# Patient Record
Sex: Male | Born: 1935 | Race: White | Hispanic: No | Marital: Married | Smoking: Never smoker
Health system: Southern US, Community
[De-identification: ages and names within clinical notes are randomized; demographics above are authoritative.]

## PROBLEM LIST (undated history)

## (undated) DIAGNOSIS — M48062 Spinal stenosis, lumbar region with neurogenic claudication: Secondary | ICD-10-CM

## (undated) DIAGNOSIS — E7800 Pure hypercholesterolemia, unspecified: Secondary | ICD-10-CM

## (undated) DIAGNOSIS — I493 Ventricular premature depolarization: Secondary | ICD-10-CM

## (undated) DIAGNOSIS — I10 Essential (primary) hypertension: Secondary | ICD-10-CM

## (undated) DIAGNOSIS — H3530 Unspecified macular degeneration: Secondary | ICD-10-CM

## (undated) DIAGNOSIS — Z973 Presence of spectacles and contact lenses: Secondary | ICD-10-CM

## (undated) DIAGNOSIS — G4700 Insomnia, unspecified: Secondary | ICD-10-CM

## (undated) DIAGNOSIS — Z87442 Personal history of urinary calculi: Secondary | ICD-10-CM

## (undated) DIAGNOSIS — Z8719 Personal history of other diseases of the digestive system: Secondary | ICD-10-CM

## (undated) DIAGNOSIS — H9190 Unspecified hearing loss, unspecified ear: Secondary | ICD-10-CM

## (undated) HISTORY — PX: BILATERAL CARPAL TUNNEL RELEASE: SHX6508

## (undated) HISTORY — PX: OTHER SURGICAL HISTORY: SHX169

## (undated) HISTORY — PX: HERNIA REPAIR: SHX51

## (undated) HISTORY — PX: TARSAL TUNNEL RELEASE: SUR1099

---

## 2008-04-24 ENCOUNTER — Encounter: Admission: RE | Admit: 2008-04-24 | Discharge: 2008-04-24 | Payer: Self-pay | Admitting: General Surgery

## 2008-04-29 ENCOUNTER — Ambulatory Visit (HOSPITAL_BASED_OUTPATIENT_CLINIC_OR_DEPARTMENT_OTHER): Admission: RE | Admit: 2008-04-29 | Discharge: 2008-04-29 | Payer: Self-pay | Admitting: General Surgery

## 2009-07-12 ENCOUNTER — Ambulatory Visit: Payer: Self-pay | Admitting: Family Medicine

## 2009-07-12 DIAGNOSIS — E785 Hyperlipidemia, unspecified: Secondary | ICD-10-CM | POA: Insufficient documentation

## 2009-07-12 DIAGNOSIS — B0089 Other herpesviral infection: Secondary | ICD-10-CM | POA: Insufficient documentation

## 2010-03-09 NOTE — Assessment & Plan Note (Signed)
Summary: Bunps-B hands/Knuckles x 1 dy rm 2   Vital Signs:  Patient Profile:   77 Years Old Male CC:      Bumps - B hands/knuckles x 1 dy Height:     71 inches Weight:      183 pounds O2 Sat:      100 % O2 treatment:    Room Air Temp:     97.1 degrees F oral Pulse rate:   93 / minute Pulse rhythm:   regular Resp:     16 per minute BP sitting:   146 / 81  (right arm) Cuff size:   regular  Vitals Entered By: Areta Haber CMA (July 12, 2009 3:12 PM)                  Current Allergies: No known allergies History of Present Illness Chief Complaint: Bumps - B hands/knuckles x 1 dy History of Present Illness: Subjective:  Patient complains of developing bumps on the joints of both hands yesterday, and more are appearing.  They are slightly tender and some appear to contain a small amount of clear fluid.  There lesions are not present elsewhere (none on feet).  No mouth lesions.  He feels well otherwise.  No fevers, chills, and sweats.  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  Current Problems: HERPETIC WHITLOW (ICD-054.6) HYPERLIPIDEMIA (ICD-272.4)   REVIEW OF SYSTEMS Constitutional Symptoms      Denies fever, chills, night sweats, weight loss, weight gain, and fatigue.  Eyes       Denies change in vision, eye pain, eye discharge, glasses, contact lenses, and eye surgery. Ear/Nose/Throat/Mouth       Denies hearing loss/aids, change in hearing, ear pain, ear discharge, dizziness, frequent runny nose, frequent nose bleeds, sinus problems, sore throat, hoarseness, and tooth pain or bleeding.  Respiratory       Denies dry cough, productive cough, wheezing, shortness of breath, asthma, bronchitis, and emphysema/COPD.  Cardiovascular       Denies murmurs, chest pain, and tires easily with exhertion.    Gastrointestinal       Denies stomach pain, nausea/vomiting, diarrhea, constipation, blood in bowel  movements, and indigestion. Genitourniary       Denies painful urination, kidney stones, and loss of urinary control. Neurological       Denies paralysis, seizures, and fainting/blackouts. Musculoskeletal       Denies muscle pain, joint pain, joint stiffness, decreased range of motion, redness, swelling, muscle weakness, and gout.  Skin       Denies bruising, unusual mles/lumps or sores, and hair/skin or nail changes.      Comments: bumps - B hands/knuckles x 1 dy Psych       Denies mood changes, temper/anger issues, anxiety/stress, speech problems, depression, and sleep problems. Other Comments: Pt states he is taking Vit B and he did wash his hands in the [facility]. Pt states he has not changes, detergents, soaps, colognes or anything. pt states that he has not been doing any gardening. Pt states he has been stress over stock market.   Past History:  Past Medical History: Hyperlipidemia Peripheral neuropathy HSV 1 Prostate PVP  Past Surgical History: Carpal tunnel release Inguinal herniorrhaphy Prostate surgery  Social History: Married Never Smoked Alcohol use-no Drug use-no Regular exercise-yes Smoking Status:  never Drug Use:  no Does Patient Exercise:  yes   Objective:  no  acute distress  Eyes:  Pupils are equal, round, and reactive to light and accomdation.  Extraocular movement is intact.  Conjunctivae are not inflamed.  Mouth:  No lesions Neck:  Supple.  No adenopathy is present.  No thyromegaly is present  Lungs:  Clear to auscultation.  Breath sounds are equal.  Heart:  Regular rate and rhythm without murmurs, rubs, or gallops.  Skin:  PIP and DIP joints of hands have single nodular slightly erythematous lesions 4mm to 7mm dia, some of which appear to have a central small vesicle.  No tenderness to palpation.  Fingers have full range of motion  Assessment New Problems: HERPETIC WHITLOW (ICD-054.6) HYPERLIPIDEMIA (ICD-272.4)  SUSPECT HERPETIC  WHITLOW  Plan New Medications/Changes: VALTREX 1 GM TABS (VALACYCLOVIR HCL) One by mouth q12hr  #14 x 0, 07/12/2009, Donna Christen MD  New Orders: New Patient Level III [phone] Planning Comments:   Begin Valtrex. Follow-up with dermatologist if not resolved one week.  Return for worsening symptoms   The patient and/or caregiver has been counseled thoroughly with regard to medications prescribed including dosage, schedule, interactions, rationale for use, and possible side effects and they verbalize understanding.  Diagnoses and expected course of recovery discussed and will return if not improved as expected or if the condition worsens. Patient and/or caregiver verbalized understanding.  Prescriptions: VALTREX 1 GM TABS (VALACYCLOVIR HCL) One by mouth q12hr  #14 x 0   Entered and Authorized by:   Donna Christen MD   Signed by:   Donna Christen MD on 07/12/2009   Method used:   Print then Give to Patient   RxID:   [id number]   Orders Added: 1)  New Patient Level III [95621]

## 2010-04-06 IMAGING — CR DG CHEST 2V
2 series · 2 of 2 positions shown · non-contrast
Comparison: None.

CLINICAL DATA: Preoperative evaluation for left inguinal hernia
repair.

CHEST - 2 VIEW

[view not recorded (1 of 2)]
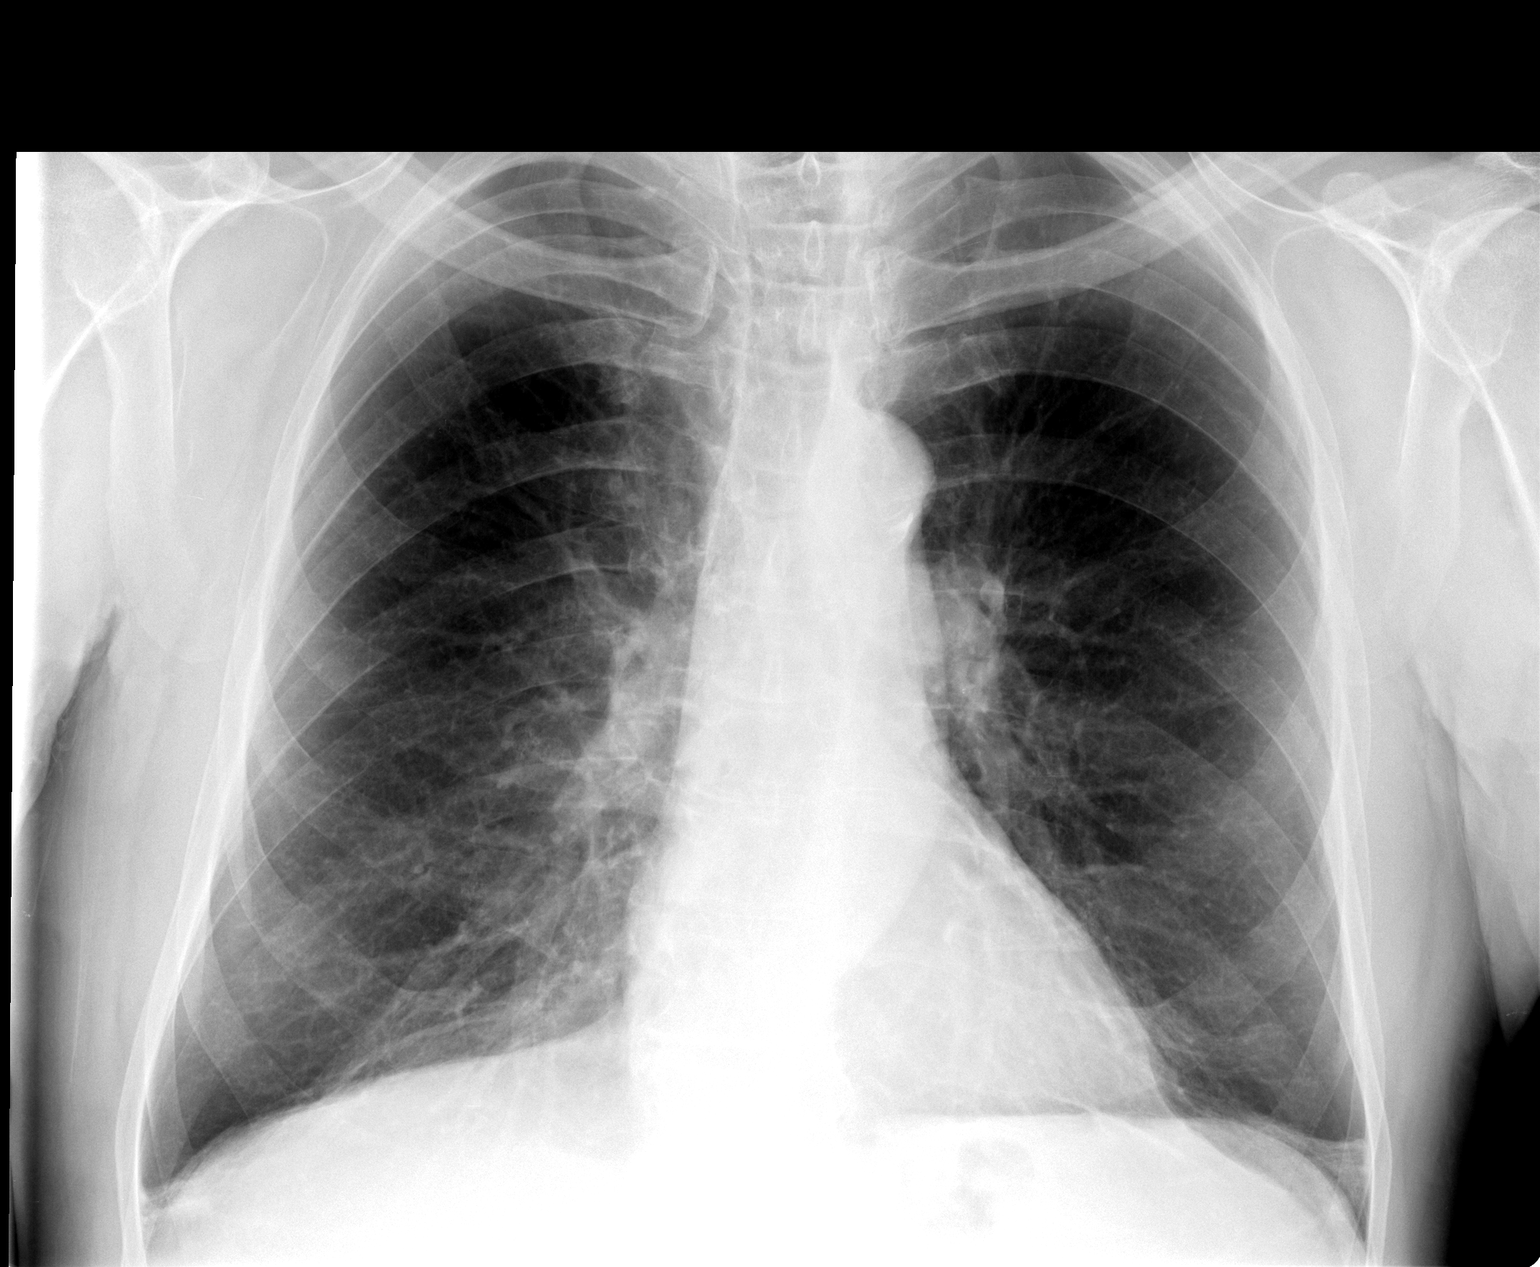

[view not recorded (2 of 2)]
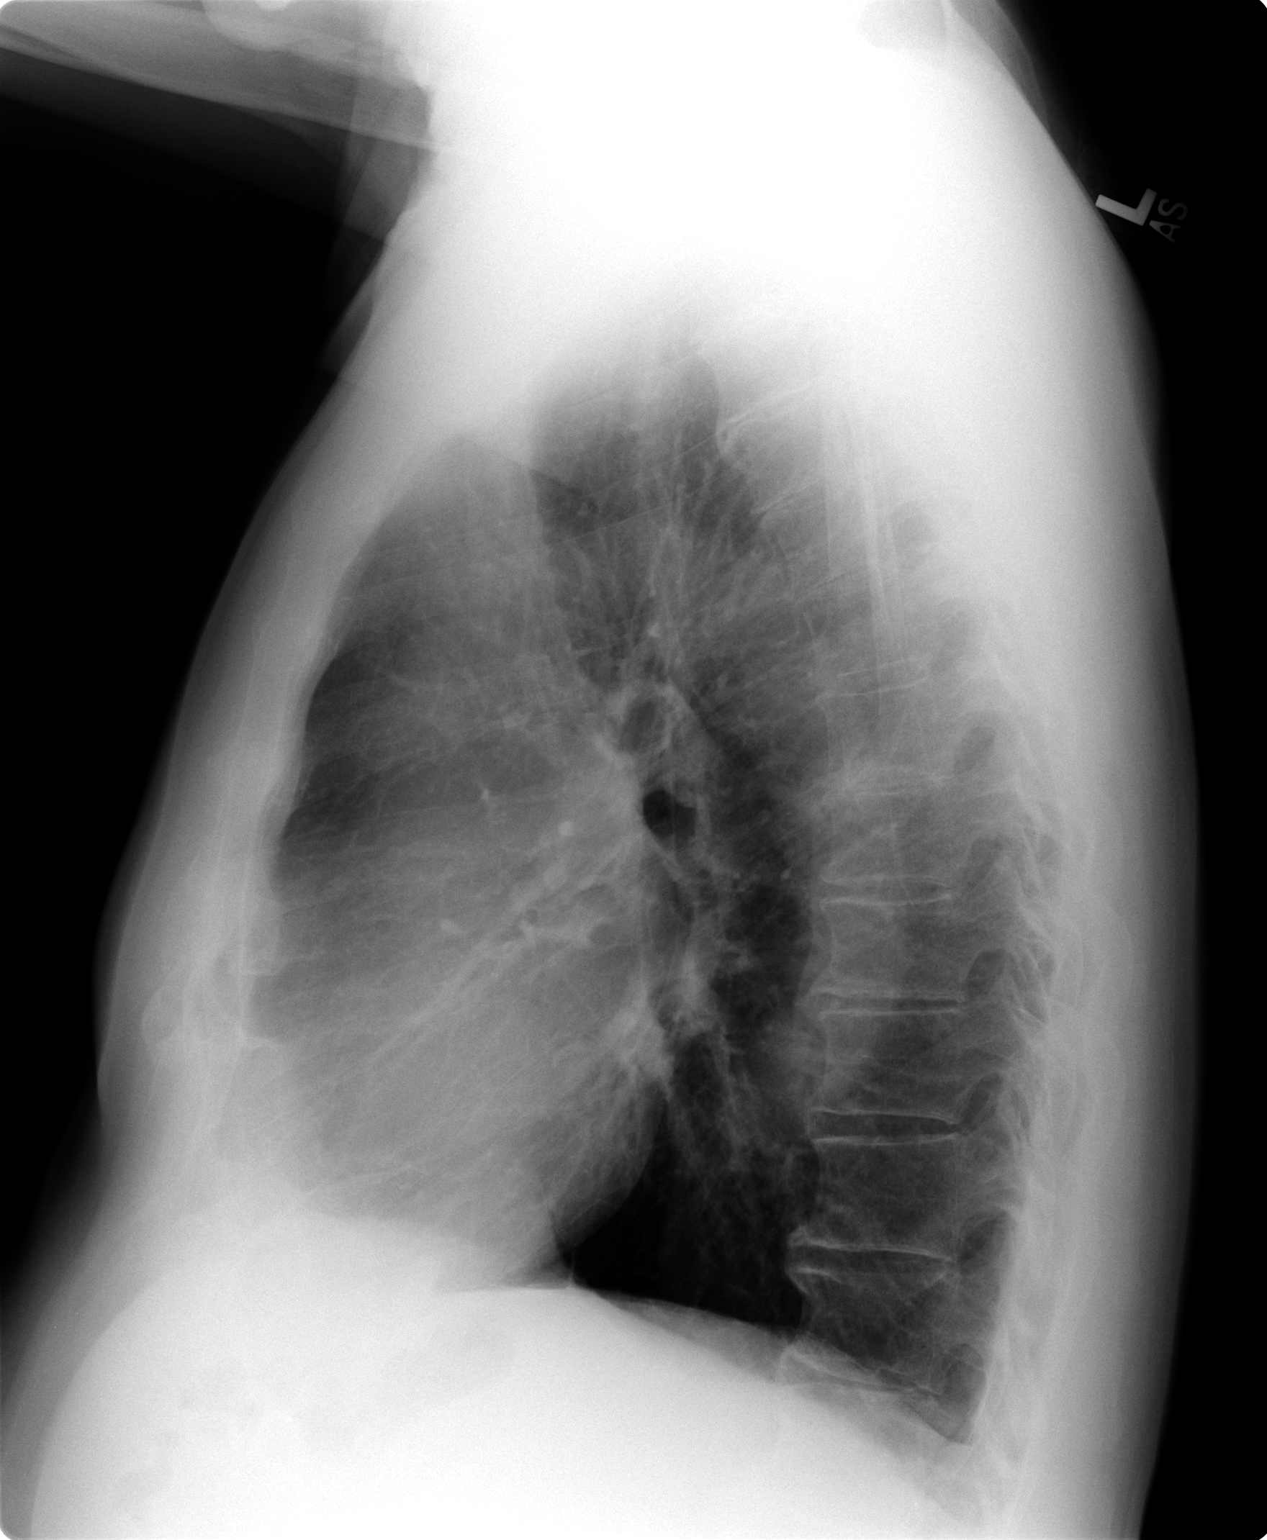

[2 of 2 positions shown; findings below may reference images not displayed]

FINDINGS: Lungs are hyperexpanded with evidence of emphysema.
There is no focal consolidation, edema, or pleural effusion. The
cardiopericardial silhouette is within normal limits for size.
Imaged bony structures of the thorax are intact.
IMPRESSION: Emphysema without acute cardiopulmonary process.

## 2010-05-20 LAB — CBC
Hemoglobin: 14.6 g/dL (ref 13.0–17.0)
MCV: 103.4 fL — ABNORMAL HIGH (ref 78.0–100.0)
RBC: 4.02 MIL/uL — ABNORMAL LOW (ref 4.22–5.81)

## 2010-05-20 LAB — BASIC METABOLIC PANEL
BUN: 12 mg/dL (ref 6–23)
Calcium: 9.1 mg/dL (ref 8.4–10.5)
GFR calc non Af Amer: >60 mL/min (ref >60)
Glucose, Bld: 76 mg/dL (ref 70–99)

## 2010-05-20 LAB — DIFFERENTIAL
Eosinophils Absolute: 0.2 10*3/uL (ref 0.0–0.7)
Lymphs Abs: 2 10*3/uL (ref 0.7–4.0)
Monocytes Absolute: 0.6 10*3/uL (ref 0.1–1.0)
Monocytes Relative: 10 % (ref 3–12)
Neutrophils Relative %: 51 % (ref 43–77)

## 2010-06-22 NOTE — Op Note (Signed)
Frederick Kirk, COLLARD NO.:  [id number]   MEDICAL RECORD NO.:  [id number]          PATIENT TYPE:  AMB   LOCATION:  DSC                          FACILITY:  [facility]   PHYSICIAN:  Juanetta Gosling, MDDATE OF BIRTH:  06/28/1931   DATE OF PROCEDURE:  04/29/2008  DATE OF DISCHARGE:                               OPERATIVE REPORT   PREOPERATIVE DIAGNOSIS:  Left inguinal hernia.   POSTOPERATIVE DIAGNOSIS:  Direct left inguinal hernia.   PROCEDURE:  Left inguinal repair with large Prolene hernia system mesh.   SURGEON:  Juanetta Gosling, MD   ASSISTANT:  None.   ANESTHESIA:  General.   SUPERVISING ANESTHESIOLOGIST:  Germaine Pomfret, MD   SPECIMENS:  None.   DRAINS:  None.   ESTIMATED BLOOD LOSS:  Minimal.   COMPLICATIONS:  None.   DISPOSITION:  To recovery room in stable condition.   INDICATIONS:  Mr. Brandow is a 77 year old male with a 1-year history of  bilateral inguinal hernia pain who about a month ago noticed a bulge on  his left side.  This was associated with some heavy activities.  On his  examination, he had a reducible, nontender left inguinal hernia and no  right inguinal hernia present.  He and I discussed open left inguinal  hernia repair with mesh.  He also has history of BPH.  We discussed the  possible need for a catheter postoperatively as well, but he seems to be  doing pretty well in terms of that right now.   PROCEDURE:  After informed consent was obtained, the patient was taken  to the operating room.  He was administered 1 g of intravenous  cefazolin.  Sequential compression devices were placed on his lower  extremities prior to induction.  He underwent general anesthesia with an  LMA.  His left groin was then prepped and draped in a standard sterile  surgical fashion.  He was prepped with ChloraPrep and an appropriate  time was passed before we began his procedure.  Surgical time-out was  performed.   A 4-cm left groin  incision was then made.  Dissection was carried out  down to the level of external abdominal oblique.  This was entered  sharply.  This was then extended through the level of the external ring.  The spermatic cord was then encircled with a Penrose drain.  The vas  deferens and surrounding cord structures were preserved throughout the  operation.  The ilioinguinal nerve was also identified and preserved  through the operation.  He had no indirect inguinal hernia identified.  He did have a small cord lipoma that was excised.  He had a large direct  defect.  This was entered with electrocautery and reduced in its  entirety.  Following this, a Ray-Tec sponge was then used to create the  preperitoneal space.  I then placed a large Prolene hernia system mesh  into this space.  The bottom portion of the bilayer was then laid flat.  I then closed his floor overlying this with a 2-0 Vicryl.  The internal  oblique was closed to  the shelving edge.  Following this, I then laid  the top portion of the bilayer flat.  This was in good position.  A T-  cut was made, wrapped around the spermatic cord.  The mesh was then  sutured into position twice at the pubic tubercle, twice medial to the T-  cut to the inguinal ligament, superiorly to the internal oblique.  The T-  cut was then tacked together with 2-0 Prolene as well and sutured to the  inguinal ligament in 2 positions.  The lateral portion was laid under  the external abdominal oblique.  The mesh was in good position.  Upon  completion, hemostasis was observed.  The external abdominal oblique was  closed with a 2-0 Vicryl.  Scarpa's fascia was closed with a 3-0 Vicryl  and the skin was closed with 4-0 Monocryl in a subcuticular fashion.  Dermabond was then placed over the wound.  A 10 mL of 0.25% Marcaine  were then infiltrated into the wound as well as performing an  ilioinguinal nerve block.  His left testicle was at the scrotum at the  completion  of the operation.  He tolerated this well, was extubated in  the operating room, and transferred to recovery room in stable  condition.      Juanetta Gosling, MD  Electronically Signed     MCW/MEDQ  D:  04/29/2008  T:  04/30/2008  Job:  [id number]   cc:   Janene Madeira, MD

## 2016-03-08 ENCOUNTER — Other Ambulatory Visit: Payer: Self-pay | Admitting: Neurosurgery

## 2016-03-09 ENCOUNTER — Encounter (HOSPITAL_COMMUNITY): Payer: Self-pay | Admitting: *Deleted

## 2016-03-09 NOTE — Progress Notes (Signed)
Pt denies SOB and chest pain but is under the care of Dr. Judithe ModestFolk, Cardiology. Pt denies having an echo and cardiac cath but stated that a stress test was performed " about 4 years ago" with Dr. Judithe ModestFolk; records requested. Pt denies having an EKG and chest x ray within the last year. Pt made aware to stop taking Aspirin, vitamins, fish oil, Melatonin and herbal medications. Do not take any NSAIDs ie: Ibuprofen, Advil, Naproxen, BC and Goody Powder or any medication containing Aspirin. Pt verbalized understanding of all pre-op instructions.

## 2016-03-10 ENCOUNTER — Encounter (HOSPITAL_COMMUNITY)
Admission: AD | Disposition: A | Discharge status: Home / Self Care | Payer: Self-pay | Source: Ambulatory Visit | Attending: Neurosurgery

## 2016-03-10 ENCOUNTER — Ambulatory Visit (HOSPITAL_COMMUNITY): Payer: Medicare Other | Admitting: Certified Registered Nurse Anesthetist

## 2016-03-10 ENCOUNTER — Encounter (HOSPITAL_COMMUNITY): Payer: Self-pay | Admitting: *Deleted

## 2016-03-10 ENCOUNTER — Inpatient Hospital Stay (HOSPITAL_COMMUNITY)
Admission: AD | Admit: 2016-03-10 | Discharge: 2016-03-11 | DRG: 516 | Disposition: A | Discharge status: Home / Self Care | Payer: Medicare Other | Source: Ambulatory Visit | Attending: Neurosurgery | Admitting: Neurosurgery

## 2016-03-10 ENCOUNTER — Ambulatory Visit (HOSPITAL_COMMUNITY): Payer: Medicare Other

## 2016-03-10 DIAGNOSIS — M4807 Spinal stenosis, lumbosacral region: Secondary | ICD-10-CM | POA: Diagnosis present

## 2016-03-10 DIAGNOSIS — Z79899 Other long term (current) drug therapy: Secondary | ICD-10-CM

## 2016-03-10 DIAGNOSIS — M5416 Radiculopathy, lumbar region: Secondary | ICD-10-CM | POA: Diagnosis present

## 2016-03-10 DIAGNOSIS — H9190 Unspecified hearing loss, unspecified ear: Secondary | ICD-10-CM | POA: Diagnosis present

## 2016-03-10 DIAGNOSIS — Z419 Encounter for procedure for purposes other than remedying health state, unspecified: Secondary | ICD-10-CM

## 2016-03-10 DIAGNOSIS — E7800 Pure hypercholesterolemia, unspecified: Secondary | ICD-10-CM | POA: Diagnosis present

## 2016-03-10 DIAGNOSIS — Z87442 Personal history of urinary calculi: Secondary | ICD-10-CM

## 2016-03-10 DIAGNOSIS — H3530 Unspecified macular degeneration: Secondary | ICD-10-CM | POA: Diagnosis present

## 2016-03-10 DIAGNOSIS — X58XXXA Exposure to other specified factors, initial encounter: Secondary | ICD-10-CM | POA: Diagnosis present

## 2016-03-10 DIAGNOSIS — M545 Low back pain: Secondary | ICD-10-CM | POA: Diagnosis present

## 2016-03-10 DIAGNOSIS — Z974 Presence of external hearing-aid: Secondary | ICD-10-CM | POA: Diagnosis not present

## 2016-03-10 DIAGNOSIS — M48062 Spinal stenosis, lumbar region with neurogenic claudication: Secondary | ICD-10-CM | POA: Diagnosis present

## 2016-03-10 DIAGNOSIS — I10 Essential (primary) hypertension: Secondary | ICD-10-CM | POA: Diagnosis present

## 2016-03-10 DIAGNOSIS — M4856XA Collapsed vertebra, not elsewhere classified, lumbar region, initial encounter for fracture: Secondary | ICD-10-CM | POA: Diagnosis present

## 2016-03-10 HISTORY — DX: Pure hypercholesterolemia, unspecified: E78.00

## 2016-03-10 HISTORY — DX: Essential (primary) hypertension: I10

## 2016-03-10 HISTORY — DX: Presence of spectacles and contact lenses: Z97.3

## 2016-03-10 HISTORY — PX: LUMBAR LAMINECTOMY/DECOMPRESSION MICRODISCECTOMY: SHX5026

## 2016-03-10 HISTORY — DX: Personal history of urinary calculi: Z87.442

## 2016-03-10 HISTORY — DX: Spinal stenosis, lumbar region with neurogenic claudication: M48.062

## 2016-03-10 HISTORY — DX: Insomnia, unspecified: G47.00

## 2016-03-10 HISTORY — DX: Ventricular premature depolarization: I49.3

## 2016-03-10 HISTORY — DX: Personal history of other diseases of the digestive system: Z87.19

## 2016-03-10 HISTORY — DX: Unspecified macular degeneration: H35.30

## 2016-03-10 HISTORY — DX: Unspecified hearing loss, unspecified ear: H91.90

## 2016-03-10 LAB — CBC
HEMATOCRIT: 41 % (ref 39.0–52.0)
Hemoglobin: 13.9 g/dL (ref 13.0–17.0)
MCH: 34.2 pg — ABNORMAL HIGH (ref 26.0–34.0)
MCHC: 33.9 g/dL (ref 30.0–36.0)
MCV: 101 fL — AB (ref 78.0–100.0)
PLATELETS: 150 10*3/uL (ref 150–400)
RBC: 4.06 MIL/uL — ABNORMAL LOW (ref 4.22–5.81)
RDW: 13.2 % (ref 11.5–15.5)
WBC: 6.5 10*3/uL (ref 4.0–10.5)

## 2016-03-10 LAB — BASIC METABOLIC PANEL
ANION GAP: 14 (ref 5–15)
BUN: 12 mg/dL (ref 6–20)
CALCIUM: 9.3 mg/dL (ref 8.9–10.3)
CO2: 21 mmol/L — AB (ref 22–32)
Chloride: 105 mmol/L (ref 101–111)
Creatinine, Ser: 1.07 mg/dL (ref 0.61–1.24)
GFR calc Af Amer: >60 mL/min (ref >60)
Glucose, Bld: 102 mg/dL — ABNORMAL HIGH (ref 65–99)
Potassium: 3.6 mmol/L (ref 3.5–5.1)
Sodium: 140 mmol/L (ref 135–145)

## 2016-03-10 LAB — SURGICAL PCR SCREEN
MRSA, PCR: NEGATIVE
Staphylococcus aureus: NEGATIVE

## 2016-03-10 SURGERY — LUMBAR LAMINECTOMY/DECOMPRESSION MICRODISCECTOMY 2 LEVELS
Anesthesia: General

## 2016-03-10 MED ORDER — EPHEDRINE SULFATE-NACL 50-0.9 MG/10ML-% IV SOSY
PREFILLED_SYRINGE | INTRAVENOUS | Status: DC | PRN
  Administered 2016-03-10 (×4): 10 mg via INTRAVENOUS

## 2016-03-10 MED ORDER — LIDOCAINE 2% (20 MG/ML) 5 ML SYRINGE
INTRAMUSCULAR | Status: AC
  Filled 2016-03-10: qty 5

## 2016-03-10 MED ORDER — BUPIVACAINE LIPOSOME 1.3 % IJ SUSP
INTRAMUSCULAR | Status: DC | PRN
Start: 2016-03-10 — End: 2016-03-10
  Administered 2016-03-10: 20 mL

## 2016-03-10 MED ORDER — HYDROCODONE-ACETAMINOPHEN 5-325 MG PO TABS
1.0000 | ORAL_TABLET | ORAL | Status: DC | PRN
  Administered 2016-03-10: 1 via ORAL
  Filled 2016-03-10: qty 1

## 2016-03-10 MED ORDER — FENTANYL CITRATE (PF) 100 MCG/2ML IJ SOLN
INTRAMUSCULAR | Status: AC
  Filled 2016-03-10: qty 2

## 2016-03-10 MED ORDER — THROMBIN 20000 UNITS EX SOLR
CUTANEOUS | Status: DC | PRN
  Administered 2016-03-10: 11:00:00 via TOPICAL

## 2016-03-10 MED ORDER — METOPROLOL SUCCINATE ER 25 MG PO TB24
37.5000 mg | ORAL_TABLET | Freq: Every day | ORAL | Status: DC
  Administered 2016-03-11: 10:00:00 37.5 mg via ORAL
  Filled 2016-03-10 (×2): qty 1

## 2016-03-10 MED ORDER — ROCURONIUM BROMIDE 100 MG/10ML IV SOLN
INTRAVENOUS | Status: DC | PRN
Start: 2016-03-10 — End: 2016-03-10
  Administered 2016-03-10: 50 mg via INTRAVENOUS

## 2016-03-10 MED ORDER — ROCURONIUM BROMIDE 50 MG/5ML IV SOSY
PREFILLED_SYRINGE | INTRAVENOUS | Status: AC
  Filled 2016-03-10: qty 5

## 2016-03-10 MED ORDER — ONDANSETRON HCL 4 MG/2ML IJ SOLN
INTRAMUSCULAR | Status: AC
  Filled 2016-03-10: qty 2

## 2016-03-10 MED ORDER — OXYCODONE HCL 5 MG PO TABS
ORAL_TABLET | ORAL | Status: AC
  Filled 2016-03-10: qty 1

## 2016-03-10 MED ORDER — MUPIROCIN 2 % EX OINT
1.0000 "application " | TOPICAL_OINTMENT | Freq: Once | CUTANEOUS | Status: AC
  Administered 2016-03-10: 1 via TOPICAL

## 2016-03-10 MED ORDER — PROPOFOL 10 MG/ML IV BOLUS
INTRAVENOUS | Status: AC
  Filled 2016-03-10: qty 20

## 2016-03-10 MED ORDER — FENTANYL CITRATE (PF) 100 MCG/2ML IJ SOLN
25.0000 ug | INTRAMUSCULAR | Status: DC | PRN
  Administered 2016-03-10: 50 ug via INTRAVENOUS

## 2016-03-10 MED ORDER — BUPIVACAINE LIPOSOME 1.3 % IJ SUSP
20.0000 mL | Freq: Once | INTRAMUSCULAR | Status: DC
  Filled 2016-03-10: qty 20

## 2016-03-10 MED ORDER — ARTIFICIAL TEARS OP OINT
TOPICAL_OINTMENT | OPHTHALMIC | Status: DC | PRN
  Administered 2016-03-10: 1 via OPHTHALMIC

## 2016-03-10 MED ORDER — ACETAMINOPHEN 325 MG PO TABS: 650.0000 mg | ORAL_TABLET | ORAL | Status: DC | PRN

## 2016-03-10 MED ORDER — CEFAZOLIN SODIUM-DEXTROSE 2-4 GM/100ML-% IV SOLN
INTRAVENOUS | Status: AC
  Filled 2016-03-10: qty 100

## 2016-03-10 MED ORDER — CYCLOBENZAPRINE HCL 10 MG PO TABS
10.0000 mg | ORAL_TABLET | Freq: Three times a day (TID) | ORAL | Status: DC | PRN
  Administered 2016-03-10: 10 mg via ORAL

## 2016-03-10 MED ORDER — PHENYLEPHRINE 40 MCG/ML (10ML) SYRINGE FOR IV PUSH (FOR BLOOD PRESSURE SUPPORT)
PREFILLED_SYRINGE | INTRAVENOUS | Status: AC
  Filled 2016-03-10: qty 10

## 2016-03-10 MED ORDER — PHENOL 1.4 % MT LIQD
1.0000 | OROMUCOSAL | Status: DC | PRN
Start: 2016-03-10 — End: 2016-03-11

## 2016-03-10 MED ORDER — BACITRACIN ZINC 500 UNIT/GM EX OINT
TOPICAL_OINTMENT | CUTANEOUS | Status: DC | PRN
  Administered 2016-03-10: 1 via TOPICAL

## 2016-03-10 MED ORDER — LACTATED RINGERS IV SOLN
INTRAVENOUS | Status: DC
  Administered 2016-03-10 (×3): via INTRAVENOUS

## 2016-03-10 MED ORDER — ACETAMINOPHEN 650 MG RE SUPP: 650.0000 mg | RECTAL | Status: DC | PRN

## 2016-03-10 MED ORDER — CALCIUM CARBONATE-VITAMIN D 500-200 MG-UNIT PO TABS: 2.0000 | ORAL_TABLET | Freq: Every day | ORAL | Status: DC

## 2016-03-10 MED ORDER — SODIUM CHLORIDE 0.9 % IR SOLN
Status: DC | PRN
  Administered 2016-03-10: 11:00:00

## 2016-03-10 MED ORDER — ONDANSETRON HCL 4 MG/2ML IJ SOLN: 4.0000 mg | INTRAMUSCULAR | Status: DC | PRN

## 2016-03-10 MED ORDER — GABAPENTIN 300 MG PO CAPS
300.0000 mg | ORAL_CAPSULE | Freq: Three times a day (TID) | ORAL | Status: DC
  Administered 2016-03-10 – 2016-03-11 (×2): 300 mg via ORAL
  Filled 2016-03-10 (×3): qty 1

## 2016-03-10 MED ORDER — SUGAMMADEX SODIUM 200 MG/2ML IV SOLN
INTRAVENOUS | Status: DC | PRN
  Administered 2016-03-10: 170 mg via INTRAVENOUS

## 2016-03-10 MED ORDER — DOCUSATE SODIUM 100 MG PO CAPS
100.0000 mg | ORAL_CAPSULE | Freq: Two times a day (BID) | ORAL | Status: DC
  Administered 2016-03-10 – 2016-03-11 (×2): 100 mg via ORAL
  Filled 2016-03-10 (×2): qty 1

## 2016-03-10 MED ORDER — SODIUM CHLORIDE 0.9% FLUSH: 3.0000 mL | INTRAVENOUS | Status: DC | PRN

## 2016-03-10 MED ORDER — MENTHOL 3 MG MT LOZG: 1.0000 | LOZENGE | OROMUCOSAL | Status: DC | PRN

## 2016-03-10 MED ORDER — OXYCODONE HCL 5 MG/5ML PO SOLN: 5.0000 mg | Freq: Once | ORAL | Status: AC | PRN

## 2016-03-10 MED ORDER — BISACODYL 10 MG RE SUPP: 10.0000 mg | Freq: Every day | RECTAL | Status: DC | PRN

## 2016-03-10 MED ORDER — ONDANSETRON HCL 4 MG/2ML IJ SOLN
INTRAMUSCULAR | Status: DC | PRN
  Administered 2016-03-10: 4 mg via INTRAVENOUS

## 2016-03-10 MED ORDER — LIDOCAINE 2% (20 MG/ML) 5 ML SYRINGE
INTRAMUSCULAR | Status: DC | PRN
  Administered 2016-03-10: 60 mg via INTRAVENOUS

## 2016-03-10 MED ORDER — BUPIVACAINE-EPINEPHRINE (PF) 0.5% -1:200000 IJ SOLN
INTRAMUSCULAR | Status: AC
  Filled 2016-03-10: qty 30

## 2016-03-10 MED ORDER — ATORVASTATIN CALCIUM 20 MG PO TABS
20.0000 mg | ORAL_TABLET | Freq: Every day | ORAL | Status: DC
  Filled 2016-03-10: qty 1

## 2016-03-10 MED ORDER — ONDANSETRON HCL 4 MG/2ML IJ SOLN: 4.0000 mg | Freq: Once | INTRAMUSCULAR | Status: DC | PRN

## 2016-03-10 MED ORDER — SUCCINYLCHOLINE CHLORIDE 200 MG/10ML IV SOSY
PREFILLED_SYRINGE | INTRAVENOUS | Status: AC
  Filled 2016-03-10: qty 10

## 2016-03-10 MED ORDER — DEXTROSE 5 % IV SOLN
INTRAVENOUS | Status: DC | PRN
  Administered 2016-03-10: 40 ug/min via INTRAVENOUS
  Administered 2016-03-10: 75 ug/min via INTRAVENOUS

## 2016-03-10 MED ORDER — CEFAZOLIN SODIUM-DEXTROSE 2-4 GM/100ML-% IV SOLN
2.0000 g | INTRAVENOUS | Status: AC
  Administered 2016-03-10: 2 g via INTRAVENOUS

## 2016-03-10 MED ORDER — EPHEDRINE 5 MG/ML INJ
INTRAVENOUS | Status: AC
  Filled 2016-03-10: qty 10

## 2016-03-10 MED ORDER — THROMBIN 20000 UNITS EX SOLR
CUTANEOUS | Status: AC
  Filled 2016-03-10: qty 20000

## 2016-03-10 MED ORDER — CYCLOBENZAPRINE HCL 10 MG PO TABS
ORAL_TABLET | ORAL | Status: AC
  Filled 2016-03-10: qty 1

## 2016-03-10 MED ORDER — SUGAMMADEX SODIUM 200 MG/2ML IV SOLN
INTRAVENOUS | Status: AC
  Filled 2016-03-10: qty 2

## 2016-03-10 MED ORDER — ARTIFICIAL TEARS OP OINT
TOPICAL_OINTMENT | OPHTHALMIC | Status: AC
  Filled 2016-03-10: qty 3.5

## 2016-03-10 MED ORDER — GELATIN ABSORBABLE MT POWD
OROMUCOSAL | Status: DC | PRN
  Administered 2016-03-10: 13:00:00 via TOPICAL

## 2016-03-10 MED ORDER — FENTANYL CITRATE (PF) 100 MCG/2ML IJ SOLN
INTRAMUSCULAR | Status: DC | PRN
  Administered 2016-03-10 (×2): 100 ug via INTRAVENOUS

## 2016-03-10 MED ORDER — MUPIROCIN 2 % EX OINT
TOPICAL_OINTMENT | CUTANEOUS | Status: AC
  Filled 2016-03-10: qty 22

## 2016-03-10 MED ORDER — CHLORHEXIDINE GLUCONATE CLOTH 2 % EX PADS: 6.0000 | MEDICATED_PAD | Freq: Once | CUTANEOUS | Status: DC

## 2016-03-10 MED ORDER — SODIUM CHLORIDE 0.9% FLUSH: 3.0000 mL | Freq: Two times a day (BID) | INTRAVENOUS | Status: DC

## 2016-03-10 MED ORDER — OXYCODONE-ACETAMINOPHEN 5-325 MG PO TABS: 1.0000 | ORAL_TABLET | ORAL | Status: DC | PRN

## 2016-03-10 MED ORDER — MORPHINE SULFATE (PF) 2 MG/ML IV SOLN: 1.0000 mg | INTRAVENOUS | Status: DC | PRN

## 2016-03-10 MED ORDER — PROPOFOL 10 MG/ML IV BOLUS
INTRAVENOUS | Status: DC | PRN
  Administered 2016-03-10: 150 mg via INTRAVENOUS

## 2016-03-10 MED ORDER — CEFAZOLIN SODIUM-DEXTROSE 2-4 GM/100ML-% IV SOLN
2.0000 g | Freq: Three times a day (TID) | INTRAVENOUS | Status: AC
  Administered 2016-03-10 – 2016-03-11 (×2): 2 g via INTRAVENOUS
  Filled 2016-03-10: qty 100

## 2016-03-10 MED ORDER — THROMBIN 5000 UNITS EX SOLR
CUTANEOUS | Status: AC
  Filled 2016-03-10: qty 5000

## 2016-03-10 MED ORDER — MORPHINE SULFATE (PF) 4 MG/ML IV SOLN: 1.0000 mg | INTRAVENOUS | Status: DC | PRN

## 2016-03-10 MED ORDER — BACITRACIN ZINC 500 UNIT/GM EX OINT
TOPICAL_OINTMENT | CUTANEOUS | Status: AC
  Filled 2016-03-10: qty 28.35

## 2016-03-10 MED ORDER — TERAZOSIN HCL 5 MG PO CAPS
10.0000 mg | ORAL_CAPSULE | Freq: Every day | ORAL | Status: DC
  Administered 2016-03-10 – 2016-03-11 (×2): 10 mg via ORAL
  Filled 2016-03-10 (×2): qty 2

## 2016-03-10 MED ORDER — OXYCODONE HCL 5 MG PO TABS
5.0000 mg | ORAL_TABLET | Freq: Once | ORAL | Status: AC | PRN
  Administered 2016-03-10: 5 mg via ORAL

## 2016-03-10 MED ORDER — BUPIVACAINE-EPINEPHRINE (PF) 0.5% -1:200000 IJ SOLN
INTRAMUSCULAR | Status: DC | PRN
  Administered 2016-03-10: 10 mL via PERINEURAL

## 2016-03-10 SURGICAL SUPPLY — 59 items
BAG DECANTER FOR FLEXI CONT (MISCELLANEOUS) ×3 IMPLANT
BENZOIN TINCTURE PRP APPL 2/3 (GAUZE/BANDAGES/DRESSINGS) ×3 IMPLANT
BLADE CLIPPER SURG (BLADE) IMPLANT
BUR MATCHSTICK NEURO 3.0 LAGG (BURR) ×3 IMPLANT
BUR PRECISION FLUTE 6.0 (BURR) IMPLANT
CANISTER SUCT 3000ML PPV (MISCELLANEOUS) ×3 IMPLANT
CARTRIDGE OIL MAESTRO DRILL (MISCELLANEOUS) ×1 IMPLANT
CLOSURE WOUND 1/2 X4 (GAUZE/BANDAGES/DRESSINGS) ×1
DIFFUSER DRILL AIR PNEUMATIC (MISCELLANEOUS) ×3 IMPLANT
DRAPE LAPAROTOMY 100X72X124 (DRAPES) ×3 IMPLANT
DRAPE MICROSCOPE LEICA (MISCELLANEOUS) ×3 IMPLANT
DRAPE POUCH INSTRU U-SHP 10X18 (DRAPES) ×3 IMPLANT
DRAPE SURG 17X23 STRL (DRAPES) ×12 IMPLANT
ELECT BLADE 4.0 EZ CLEAN MEGAD (MISCELLANEOUS) ×3
ELECT REM PT RETURN 9FT ADLT (ELECTROSURGICAL) ×3
ELECTRODE BLDE 4.0 EZ CLN MEGD (MISCELLANEOUS) ×1 IMPLANT
ELECTRODE REM PT RTRN 9FT ADLT (ELECTROSURGICAL) ×1 IMPLANT
GAUZE SPONGE 4X4 12PLY STRL (GAUZE/BANDAGES/DRESSINGS) ×3 IMPLANT
GAUZE SPONGE 4X4 16PLY XRAY LF (GAUZE/BANDAGES/DRESSINGS) IMPLANT
GLOVE BIO SURGEON STRL SZ8 (GLOVE) ×3 IMPLANT
GLOVE BIO SURGEON STRL SZ8.5 (GLOVE) ×3 IMPLANT
GLOVE BIOGEL PI IND STRL 7.0 (GLOVE) ×3 IMPLANT
GLOVE BIOGEL PI IND STRL 7.5 (GLOVE) ×1 IMPLANT
GLOVE BIOGEL PI IND STRL 8.5 (GLOVE) ×1 IMPLANT
GLOVE BIOGEL PI INDICATOR 7.0 (GLOVE) ×6
GLOVE BIOGEL PI INDICATOR 7.5 (GLOVE) ×2
GLOVE BIOGEL PI INDICATOR 8.5 (GLOVE) ×2
GLOVE ECLIPSE 8.5 STRL (GLOVE) ×6 IMPLANT
GLOVE EXAM NITRILE LRG STRL (GLOVE) ×3 IMPLANT
GLOVE EXAM NITRILE XL STR (GLOVE) ×3 IMPLANT
GLOVE EXAM NITRILE XS STR PU (GLOVE) IMPLANT
GLOVE INDICATOR 7.5 STRL GRN (GLOVE) ×3 IMPLANT
GLOVE INDICATOR 8.5 STRL (GLOVE) ×3 IMPLANT
GLOVE SURG SS PI 7.0 STRL IVOR (GLOVE) ×3 IMPLANT
GOWN STRL REUS W/ TWL LRG LVL3 (GOWN DISPOSABLE) ×2 IMPLANT
GOWN STRL REUS W/ TWL XL LVL3 (GOWN DISPOSABLE) ×1 IMPLANT
GOWN STRL REUS W/TWL 2XL LVL3 (GOWN DISPOSABLE) ×3 IMPLANT
GOWN STRL REUS W/TWL LRG LVL3 (GOWN DISPOSABLE) ×4
GOWN STRL REUS W/TWL XL LVL3 (GOWN DISPOSABLE) ×2
HEMOSTAT POWDER SURGIFOAM 1G (HEMOSTASIS) ×3 IMPLANT
KIT BASIN OR (CUSTOM PROCEDURE TRAY) ×3 IMPLANT
KIT ROOM TURNOVER OR (KITS) ×3 IMPLANT
NEEDLE HYPO 21X1.5 SAFETY (NEEDLE) ×3 IMPLANT
NEEDLE HYPO 22GX1.5 SAFETY (NEEDLE) ×3 IMPLANT
NS IRRIG 1000ML POUR BTL (IV SOLUTION) ×3 IMPLANT
OIL CARTRIDGE MAESTRO DRILL (MISCELLANEOUS) ×3
PACK LAMINECTOMY NEURO (CUSTOM PROCEDURE TRAY) ×3 IMPLANT
PAD ARMBOARD 7.5X6 YLW CONV (MISCELLANEOUS) ×9 IMPLANT
PATTIES SURGICAL .5 X1 (DISPOSABLE) IMPLANT
RUBBERBAND STERILE (MISCELLANEOUS) ×6 IMPLANT
SPONGE SURGIFOAM ABS GEL 100 (HEMOSTASIS) ×3 IMPLANT
STRIP CLOSURE SKIN 1/2X4 (GAUZE/BANDAGES/DRESSINGS) ×2 IMPLANT
SUT VIC AB 1 CT1 18XBRD ANBCTR (SUTURE) ×2 IMPLANT
SUT VIC AB 1 CT1 8-18 (SUTURE) ×4
SUT VIC AB 2-0 CP2 18 (SUTURE) ×6 IMPLANT
SYRINGE 20CC LL (MISCELLANEOUS) ×3 IMPLANT
TOWEL OR 17X24 6PK STRL BLUE (TOWEL DISPOSABLE) ×3 IMPLANT
TOWEL OR 17X26 10 PK STRL BLUE (TOWEL DISPOSABLE) ×3 IMPLANT
WATER STERILE IRR 1000ML POUR (IV SOLUTION) ×3 IMPLANT

## 2016-03-10 NOTE — Anesthesia Preprocedure Evaluation (Signed)
Anesthesia Evaluation  Patient identified by MRN, date of birth, ID band Patient awake    Reviewed: Allergy & Precautions, NPO status , Patient's Chart, lab work & pertinent test results  Airway Mallampati: II  TM Distance: >3 FB Neck ROM: Full    Dental  (+) Teeth Intact, Dental Advisory Given   Pulmonary    breath sounds clear to auscultation       Cardiovascular hypertension,  Rhythm:Regular Rate:Normal     Neuro/Psych    GI/Hepatic   Endo/Other    Renal/GU      Musculoskeletal   Abdominal   Peds  Hematology   Anesthesia Other Findings   Reproductive/Obstetrics                             Anesthesia Physical Anesthesia Plan  ASA: III  Anesthesia Plan: General   Post-op Pain Management:    Induction: Intravenous  Airway Management Planned: Oral ETT  Additional Equipment:   Intra-op Plan:   Post-operative Plan: Extubation in OR  Informed Consent: I have reviewed the patients History and Physical, chart, labs and discussed the procedure including the risks, benefits and alternatives for the proposed anesthesia with the patient or authorized representative who has indicated his/her understanding and acceptance.   Dental advisory given  Plan Discussed with: CRNA and Anesthesiologist  Anesthesia Plan Comments:         Anesthesia Quick Evaluation  

## 2016-03-10 NOTE — Transfer of Care (Signed)
Immediate Anesthesia Transfer of Care Note  Patient: Frederick Kirk  Procedure(s) Performed: Procedure(s): LAMINECTOMY, DISCECTOMY AND FORAMINOTOMY LUMBAR FOUR LUMBAR FIVE, LUMBAR FIVE SACRAL ONE (N/A)  Patient Location: PACU  Anesthesia Type:General  Level of Consciousness: sedated  Airway & Oxygen Therapy: Patient Spontanous Breathing and Patient connected to nasal cannula oxygen  Post-op Assessment: Report given to RN, Post -op Vital signs reviewed and stable and Patient moving all extremities  Post vital signs: Reviewed and stable  Last Vitals:  Vitals:   03/10/16 1430 03/10/16 1445  BP:    Pulse: (!) 59 (!) 57  Resp: 15 16  Temp:      Last Pain:  Vitals:   03/10/16 1445  TempSrc:   PainSc: 4       Patients Stated Pain Goal: 2 (03/10/16 16100808)  Complications: No apparent anesthesia complications

## 2016-03-10 NOTE — H&P (Signed)
Subjective: The patient is an 83 year old white male who has complained of back, buttock and leg pain consistent with neurogenic claudication. He has failed medical management and was worked up with a lumbar MRI. This demonstrated the patient had a mild compression fracture, herniated disc and spinal stenosis at L4-5 and L5-S1. I discussed the various treatment options with the patient including surgery. He has decided proceed with L4-5 and L5-S1 laminectomy and discectomy.  Past Medical History:  Diagnosis Date  . History of hiatal hernia   . History of kidney stones   . HOH (hard of hearing)    wears hearing aids B/L   . Hypercholesteremia   . Hypertension   . Insomnia   . Macular degeneration    early B/L  . PVC's (premature ventricular contractions)   . Spinal stenosis of lumbar region with neurogenic claudication   . Wears glasses     Past Surgical History:  Procedure Laterality Date  . BILATERAL CARPAL TUNNEL RELEASE    . HERNIA REPAIR    . laparoscopic knee surgery    . TARSAL TUNNEL RELEASE      Allergies  Allergen Reactions  . No Known Allergies     Social History  Substance Use Topics  . Smoking status: Never Smoker  . Smokeless tobacco: Never Used  . Alcohol use No    Family History  Problem Relation Age of Onset  . ALS Mother    Prior to Admission medications   Medication Sig Start Date End Date Taking? Authorizing Provider  atorvastatin (LIPITOR) 20 MG tablet Take 1 tablet by mouth every evening. 12/14/15  Yes Historical Provider, MD  Calcium Carbonate-Vitamin D (CALCIUM PLUS VITAMIN D) 300-100 MG-UNIT CAPS Take 3 tablets by mouth daily.   Yes Historical Provider, MD  gabapentin (NEURONTIN) 300 MG capsule Take 1 capsule by mouth 3 (three) times daily. 02/18/16  Yes Historical Provider, MD  Melatonin 5 MG TABS Take 5 mg by mouth at bedtime.   Yes Historical Provider, MD  metoprolol succinate (TOPROL-XL) 25 MG 24 hr tablet Take 37.5 mg by mouth daily. 01/29/16   Yes Historical Provider, MD  terazosin (HYTRIN) 10 MG capsule Take 1 capsule by mouth daily. 02/26/16  Yes Historical Provider, MD     Review of Systems  Positive ROS: As above  All other systems have been reviewed and were otherwise negative with the exception of those mentioned in the HPI and as above.  Objective: Vital signs in last 24 hours: Temp:  [97.9 F (36.6 C)] 97.9 F (36.6 C) (02/01 0824) Pulse Rate:  [67] 67 (02/01 0824) Resp:  [18] 18 (02/01 0824) BP: (175)/(66) 175/66 (02/01 0824) SpO2:  [98 %] 98 % (02/01 0824) Weight:  [77.1 kg (170 lb)] 77.1 kg (170 lb) (02/01 0815)  General Appearance: Alert Head: Normocephalic, without obvious abnormality, atraumatic Eyes: PERRL, conjunctiva/corneas clear, EOM's intact,    Ears: Normal  Throat: Normal  Neck: Supple, Back: unremarkable Lungs: Clear to auscultation bilaterally, respirations unlabored Heart: Regular rate and rhythm, no murmur, rub or gallop Abdomen: Soft, non-tender Extremities: Extremities normal, atraumatic, no cyanosis or edema Skin: unremarkable  NEUROLOGIC:   Mental status: alert and oriented,Motor Exam - grossly normal Sensory Exam - grossly normal Reflexes:  Coordination - grossly normal Gait - grossly normal Balance - grossly normal Cranial Nerves: I: smell Not tested  II: visual acuity  OS: Normal  OD: Normal   II: visual fields Full to confrontation  II: pupils Equal, round, reactive to light  III,VII: ptosis None  III,IV,VI: extraocular muscles  Full ROM  V: mastication Normal  V: facial light touch sensation  Normal  V,VII: corneal reflex  Present  VII: facial muscle function - upper  Normal  VII: facial muscle function - lower Normal  VIII: hearing Not tested  IX: soft palate elevation  Normal  IX,X: gag reflex Present  XI: trapezius strength  5/5  XI: sternocleidomastoid strength 5/5  XI: neck flexion strength  5/5  XII: tongue strength  Normal    Data Review Lab Results   Component Value Date   WBC 6.5 03/10/2016   HGB 13.9 03/10/2016   HCT 41.0 03/10/2016   MCV 101.0 (H) 03/10/2016   PLT 150 03/10/2016   Lab Results  Component Value Date   NA 140 03/10/2016   K 3.6 03/10/2016   CL 105 03/10/2016   CO2 21 (L) 03/10/2016   BUN 12 03/10/2016   CREATININE 1.07 03/10/2016   GLUCOSE 102 (H) 03/10/2016   No results found for: INR, PROTIME  Assessment/Plan: L4-5 and L5-S1 spinal stenosis, herniated disks, neurogenic claudication, lumbar radiculopathy, lumbago, compression fracture: I have discussed the situation with the patient and his wife and reviewed his imaging studies with him. We have discussed various treatment options including surgery. I have described the surgical treatment option of an L4-5 and L5-S1 laminectomy and discectomy. I have shown them surgical models. We have discussed the risks, benefits, alternatives, and likelihood of achieving her goals for surgery. I have answered all the patient's questions. He has decided to proceed with surgery.   Coden Franchi D 03/10/2016 10:52 AM

## 2016-03-10 NOTE — Anesthesia Procedure Notes (Signed)
Procedure Name: Intubation Date/Time: 03/10/2016 11:10 AM Performed by: Trixie Deis A Pre-anesthesia Checklist: Patient identified, Emergency Drugs available, Suction available and Patient being monitored Patient Re-evaluated:Patient Re-evaluated prior to inductionOxygen Delivery Method: Circle System Utilized Preoxygenation: Pre-oxygenation with 100% oxygen Intubation Type: IV induction Ventilation: Mask ventilation without difficulty Laryngoscope Size: Mac and 4 Grade View: Grade I Tube type: Oral Tube size: 8.0 mm Number of attempts: 1 Airway Equipment and Method: Stylet and Oral airway Placement Confirmation: ETT inserted through vocal cords under direct vision,  positive ETCO2 and breath sounds checked- equal and bilateral Secured at: 22 cm Tube secured with: Tape Dental Injury: Teeth and Oropharynx as per pre-operative assessment

## 2016-03-10 NOTE — Anesthesia Postprocedure Evaluation (Signed)
Anesthesia Post Note  Patient: Frederick Kirk  Procedure(s) Performed: Procedure(s) (LRB): LAMINECTOMY, DISCECTOMY AND FORAMINOTOMY LUMBAR FOUR LUMBAR FIVE, LUMBAR FIVE SACRAL ONE (N/A)  Patient location during evaluation: PACU Anesthesia Type: General Level of consciousness: awake, awake and alert and oriented Pain management: pain level controlled Vital Signs Assessment: post-procedure vital signs reviewed and stable Respiratory status: spontaneous breathing, nonlabored ventilation and respiratory function stable Cardiovascular status: blood pressure returned to baseline Anesthetic complications: no       Last Vitals:  Vitals:   03/10/16 1515 03/10/16 1554  BP:  (!) 153/86  Pulse:  (!) 58  Resp: 16 16  Temp: 36.1 C     Last Pain:  Vitals:   03/10/16 1515  TempSrc:   PainSc: 2                  Emari Hreha COKER

## 2016-03-10 NOTE — Op Note (Signed)
Brief history: The patient is an 83 year old white male who has complained of back and bilateral buttock and leg pain consistent with neurogenic claudication. He has failed medical management. He was worked up with a lumbar MRI which demonstrated spinal stenosis and a right herniated disc at L4-5 and L5-S1. I discussed the various treatment options with the patient including surgery. He has weighed the risks, benefits, and alternatives to surgery and decided proceed with a L4-5 and L5-S1 laminectomy and discectomy.  Preoperative diagnosis: L4-5 and L5-S1 spinal stenosis, lumbar radiculopathy, neurogenic claudication, lumbago, herniated disc  Postoperative diagnosis: L4-5 and L5-S1 spinal stenosis, lumbago, lumbar radiculopathy, neurogenic claudication  Procedure: L5-S1 laminectomy, bilateral L4-5 laminotomy foraminotomy using micro-dissection to decompress the bilateral L5 and S1 nerve roots  Surgeon: Dr. Delma OfficerJeff Neeko Pharo  Asst.: Barnett AbuHenry Elsner  Anesthesia: Gen. endotracheal  Estimated blood loss: 125 mL  Drains: None  Complications: None  Description of procedure: The patient was brought to the operating room by the anesthesia team. General endotracheal anesthesia was induced. The patient was turned to the prone position on the Wilson frame. The patient's lumbosacral region was then prepared with Betadine scrub and Betadine solution. Sterile drapes were applied.  I then injected the area to be incised with Marcaine with epinephrine solution. I then used a scalpel to make a linear midline incision over the L4-5 and L5-S1 intervertebral disc space. I then used electrocautery to perform a bilateral subperiosteal dissection exposing the spinous process and lamina of L4, L5 and the upper sacrum. We obtained intraoperative radiograph to confirm our location. I then inserted the [facility] for exposure.  We then brought the operative microscope into the field. Under its magnification and  illumination we completed the microdissection. I used a high-speed drill to perform bilateral laminotomies at L4-5 and L5-S1. I then used a Kerrison punches to complete the L5 laminectomy and to widen the bilateral laminotomies at L4-5 and removed the ligamentum flavum at L4-5 and L5-S1. We then used microdissection to free up the thecal sac and the L5-S1 nerve root from the epidural tissue. I then used a Kerrison punch to perform a foraminotomy at about the L5 and S1 nerve root. We then using the nerve root retractor to gently retract the right thecal sac at L4-5 and L5-S1 medially. We inspected ventrally but did not find a herniated disc much just quite a bit of fat. I then palpated along the ventral surface of the thecal sac and along exit route of the bilateral L5 and S1 nerve root and noted that the neural structures were well decompressed. This completed the decompression.  We then obtained hemostasis using bipolar electrocautery. We irrigated the wound out with bacitracin solution. We then removed the retractor. We then reapproximated the patient's thoracolumbar fascia with interrupted #1 Vicryl suture. We then reapproximated the patient's subcutaneous tissue with interrupted 2-0 Vicryl suture. We then reapproximated patient's skin with Steri-Strips and benzoin. The was then coated with bacitracin ointment. The drapes were removed. The patient was subsequently returned to the supine position where they were extubated by the anesthesia team. The patient was then transported to the postanesthesia care unit in stable condition. All sponge instrument and needle counts were reportedly correct at the end of this case.

## 2016-03-10 NOTE — Progress Notes (Signed)
Staff on 3C unable to take report -they need to call me back.

## 2016-03-11 ENCOUNTER — Encounter (HOSPITAL_COMMUNITY): Payer: Self-pay | Admitting: Neurosurgery

## 2016-03-11 LAB — BASIC METABOLIC PANEL
ANION GAP: 12 (ref 5–15)
BUN: 12 mg/dL (ref 6–20)
CALCIUM: 8.6 mg/dL — AB (ref 8.9–10.3)
CO2: 24 mmol/L (ref 22–32)
Chloride: 102 mmol/L (ref 101–111)
Creatinine, Ser: 1.25 mg/dL — ABNORMAL HIGH (ref 0.61–1.24)
GFR calc Af Amer: >60 mL/min (ref >60)
GFR calc non Af Amer: 52 mL/min — ABNORMAL LOW (ref >60)
GLUCOSE: 119 mg/dL — AB (ref 65–99)
Potassium: 3.8 mmol/L (ref 3.5–5.1)
SODIUM: 138 mmol/L (ref 135–145)

## 2016-03-11 LAB — CBC
HCT: 37.6 % — ABNORMAL LOW (ref 39.0–52.0)
Hemoglobin: 12.4 g/dL — ABNORMAL LOW (ref 13.0–17.0)
MCH: 34 pg (ref 26.0–34.0)
MCHC: 33 g/dL (ref 30.0–36.0)
MCV: 103 fL — AB (ref 78.0–100.0)
PLATELETS: 141 10*3/uL — AB (ref 150–400)
RBC: 3.65 MIL/uL — ABNORMAL LOW (ref 4.22–5.81)
RDW: 13.5 % (ref 11.5–15.5)
WBC: 7.5 10*3/uL (ref 4.0–10.5)

## 2016-03-11 MED ORDER — CYCLOBENZAPRINE HCL 5 MG PO TABS: 5.0000 mg | ORAL_TABLET | Freq: Three times a day (TID) | ORAL | Status: DC | PRN

## 2016-03-11 MED ORDER — DOCUSATE SODIUM 100 MG PO CAPS: 100.0000 mg | ORAL_CAPSULE | Freq: Two times a day (BID) | ORAL | 0 refills | Status: DC

## 2016-03-11 MED ORDER — OXYCODONE-ACETAMINOPHEN 5-325 MG PO TABS: 1.0000 | ORAL_TABLET | ORAL | 0 refills | Status: DC | PRN

## 2016-03-11 MED ORDER — CYCLOBENZAPRINE HCL 5 MG PO TABS: 5.0000 mg | ORAL_TABLET | Freq: Three times a day (TID) | ORAL | 1 refills | Status: DC | PRN

## 2016-03-11 NOTE — Evaluation (Signed)
Physical Therapy Evaluation Patient Details Name: Frederick Kirk MRN: [id number] DOB: 08-03-1931 Today's Date: 03/11/2016   History of Present Illness  Pt is an 83 y/o male who presents s/p L4-S1 laminectomy/decompression on 03/10/16.  Clinical Impression  Pt admitted with above diagnosis. Pt currently with functional limitations due to the deficits listed below (see PT Problem List). At the time of PT eval pt was able to perform transfers and ambulation with min guard to supervision for safety. Pt very detail oriented and would benefit from another PT session if still here tomorrow. Pt will benefit from skilled PT to increase their independence and safety with mobility to allow discharge to the venue listed below.       Follow Up Recommendations Outpatient PT    Equipment Recommendations  3in1 (PT)    Recommendations for Other Services       Precautions / Restrictions Precautions Precautions: Fall;Back Precaution Booklet Issued: Yes (comment) Precaution Comments: Reviewed precautions sheet in detail. Pt was cued for precautions during functional mobility.  Pt has bilateral ankle braces he wears daily.  Restrictions Weight Bearing Restrictions: No      Mobility  Bed Mobility Overal bed mobility: Needs Assistance Bed Mobility: Rolling;Sidelying to Sit Rolling: Supervision Sidelying to sit: Supervision       General bed mobility comments: Gross supervision for safety. Pt was able to transition to EOB without physical assist. VC's provided for log roll technique.   Transfers Overall transfer level: Needs assistance Equipment used: Rolling walker (2 wheeled) Transfers: Sit to/from Stand Sit to Stand: Min guard         General transfer comment: Close guard for safety. Pt was able to power-up to full standing position with VC's for hand placement on seated surface for safety. Slight unsteadiness noted however pt was able to recover without assist.    Ambulation/Gait Ambulation/Gait assistance: Min guard Ambulation Distance (Feet): 200 Feet Assistive device: Rolling walker (2 wheeled) Gait Pattern/deviations: Step-through pattern;Decreased stride length;Trunk flexed Gait velocity: Decreased Gait velocity interpretation: Below normal speed for age/gender General Gait Details: VC's for improved posture and general safety with the RW. Pt with generally flexed posture and difficulty maintaining corrective changes with cues.   Stairs            Wheelchair Mobility    Modified Rankin (Stroke Patients Only)       Balance Overall balance assessment: Needs assistance Sitting-balance support: Feet supported;No upper extremity supported Sitting balance-Leahy Scale: Good     Standing balance support: No upper extremity supported;During functional activity Standing balance-Leahy Scale: Fair Standing balance comment: Able to maintain standing balance without UE support for short bouts.                              Pertinent Vitals/Pain Pain Assessment: Faces Faces Pain Scale: Hurts a little bit Pain Location: Incision site Pain Descriptors / Indicators: Operative site guarding;Sore Pain Intervention(s): Limited activity within patient's tolerance;Monitored during session;Repositioned    Home Living Family/patient expects to be discharged to:: Private residence Living Arrangements: Spouse/significant other Available Help at Discharge: Family;Available 24 hours/day Type of Home: House Home Access: Level entry     Home Layout: One level Home Equipment: Walker - 4 wheels;Shower seat      Prior Function Level of Independence: Needs assistance         Comments: Has been using rollator for support. Pt reports that wife has been assisting with shoes/socks for a while  now.      Hand Dominance   Dominant Hand: Right    Extremity/Trunk Assessment   Upper Extremity Assessment Upper Extremity Assessment:  Overall WFL for tasks assessed    Lower Extremity Assessment Lower Extremity Assessment: Generalized weakness    Cervical / Trunk Assessment Cervical / Trunk Assessment: Kyphotic;Other exceptions Cervical / Trunk Exceptions: Forward head/rounded shoulder posture  Communication   Communication: HOH  Cognition Arousal/Alertness: Awake/alert Behavior During Therapy: WFL for tasks assessed/performed Overall Cognitive Status: Within Functional Limits for tasks assessed                      General Comments      Exercises     Assessment/Plan    PT Assessment Patient needs continued PT services  PT Problem List Decreased strength;Decreased range of motion;Decreased activity tolerance;Decreased balance;Decreased mobility;Decreased knowledge of use of DME;Decreased safety awareness;Decreased knowledge of precautions;Pain          PT Treatment Interventions DME instruction;Gait training;Stair training;Functional mobility training;Therapeutic activities;Therapeutic exercise;Neuromuscular re-education;Patient/family education    PT Goals (Current goals can be found in the Care Plan section)  Acute Rehab PT Goals Patient Stated Goal: Home today PT Goal Formulation: With patient Time For Goal Achievement: 03/18/16 Potential to Achieve Goals: Good    Frequency Min 5X/week   Barriers to discharge        Co-evaluation               End of Session   Activity Tolerance: Patient tolerated treatment well Patient left: with call bell/phone within reach (Sitting EOB to eat breakfast) Nurse Communication: Mobility status         Time: [id number] PT Time Calculation (min) (ACUTE ONLY): 26 min   Charges:   PT Evaluation $PT Eval Moderate Complexity: 1 Procedure PT Treatments $Gait Training: 8-22 mins   PT G Codes:        Marylynn PearsonLaura D Pinchus Weckwerth 03/11/2016, 10:03 AM   Conni SlipperLaura Meiko Stranahan, PT, DPT Acute Rehabilitation Services Pager: [phone]

## 2016-03-11 NOTE — Discharge Summary (Signed)
Physician Discharge Summary  Patient ID: Frederick GrumblingGerald Kirk MRN: [id number] DOB/AGE: 06/28/1931 83 y.o.  Admit date: 03/10/2016 Discharge date: 03/11/2016  Admission Diagnoses:Lumbar spinal stenosis with neurogenic claudication, lumbago, lumbar radiculopathy on the lumbar compression fracture  Discharge Diagnoses: The same Active Problems:   Spinal stenosis of lumbar region with neurogenic claudication   Discharged Condition: good  Hospital Course: I performed an L4-5 and L5-S1 laminectomy on the patient on 03/10/2016. The surgery went well.  The patient's postoperative course was unremarkable. On postoperative day #1 the patient looked and felt well. His leg pain had improved since surgery. He requested discharge to home. He was given written and oral discharge instructions. All his questions were answered.  Consults: Physical therapy Significant Diagnostic Studies: None Treatments: L4-5 and L5-S1 laminectomy/laminotomy/foraminotomies Discharge Exam: Blood pressure 129/68, pulse 89, temperature 98.2 F (36.8 C), resp. rate 18, height 5\' 11"  (1.803 m), weight 77.1 kg (170 lb), SpO2 99 %. The patient is alert and pleasant. He looks well. His dressing is clean and dry. His strength is grossly normal in his lower extremities.  Disposition: Home  Discharge Instructions    Call MD for:  difficulty breathing, headache or visual disturbances    Complete by:  As directed    Call MD for:  extreme fatigue    Complete by:  As directed    Call MD for:  hives    Complete by:  As directed    Call MD for:  persistant dizziness or light-headedness    Complete by:  As directed    Call MD for:  persistant nausea and vomiting    Complete by:  As directed    Call MD for:  redness, tenderness, or signs of infection (pain, swelling, redness, odor or green/yellow discharge around incision site)    Complete by:  As directed    Call MD for:  severe uncontrolled pain    Complete by:  As directed    Call MD  for:  temperature >100.4    Complete by:  As directed    Diet - low sodium heart healthy    Complete by:  As directed    Discharge instructions    Complete by:  As directed    Call [phone] for a followup appointment. Take a stool softener while you are using pain medications.   Driving Restrictions    Complete by:  As directed    Do not drive for 2 weeks.   Increase activity slowly    Complete by:  As directed    Lifting restrictions    Complete by:  As directed    Do not lift more than 5 pounds. No excessive bending or twisting.   May shower / Bathe    Complete by:  As directed    He may shower after the pain she is removed 3 days after surgery. Leave the incision alone.   Remove dressing in 48 hours    Complete by:  As directed    Your stitches are under the scan and will dissolve by themselves. The Steri-Strips will fall off after you take a few showers. Do not rub back or pick at the wound, Leave the wound alone.     Allergies as of 03/11/2016      Reactions   No Known Allergies       Medication List    TAKE these medications   atorvastatin 20 MG tablet Commonly known as:  LIPITOR Take 1 tablet by mouth every evening.  CALCIUM PLUS VITAMIN D 300-100 MG-UNIT Caps Generic drug:  Calcium Carbonate-Vitamin D Take 3 tablets by mouth daily.   cyclobenzaprine 5 MG tablet Commonly known as:  FLEXERIL Take 1 tablet (5 mg total) by mouth 3 (three) times daily as needed for muscle spasms.   docusate sodium 100 MG capsule Commonly known as:  COLACE Take 1 capsule (100 mg total) by mouth 2 (two) times daily.   gabapentin 300 MG capsule Commonly known as:  NEURONTIN Take 1 capsule by mouth 3 (three) times daily.   Melatonin 5 MG Tabs Take 5 mg by mouth at bedtime.   metoprolol succinate 25 MG 24 hr tablet Commonly known as:  TOPROL-XL Take 37.5 mg by mouth daily.   oxyCODONE-acetaminophen 5-325 MG tablet Commonly known as:  PERCOCET/ROXICET Take 1-2 tablets by  mouth every 4 (four) hours as needed for moderate pain.   terazosin 10 MG capsule Commonly known as:  HYTRIN Take 1 capsule by mouth daily.        SignedTressie Stalker D 03/11/2016, 8:05 AM

## 2016-03-11 NOTE — Progress Notes (Signed)
Patient alert and oriented, mae's well, voiding adequate amount of urine, swallowing without difficulty, no c/o pain. Patient discharged home with family. Script and discharged instructions given to patient. Patient and family stated understanding of d/c instructions given and has an appointment with Dr. Jenkins.   

## 2016-06-27 ENCOUNTER — Encounter: Payer: Self-pay | Admitting: Family Medicine

## 2016-06-27 ENCOUNTER — Ambulatory Visit (INDEPENDENT_AMBULATORY_CARE_PROVIDER_SITE_OTHER): Payer: Medicare Other | Admitting: Family Medicine

## 2016-06-27 VITALS — BP 149/58 | HR 58 | Wt 184.0 lb

## 2016-06-27 DIAGNOSIS — M216X2 Other acquired deformities of left foot: Secondary | ICD-10-CM | POA: Diagnosis not present

## 2016-06-27 NOTE — Patient Instructions (Signed)
Thank you for coming in today. Schedule for orthotics in the near future.  30 min or 40 min visit please.

## 2016-06-27 NOTE — Progress Notes (Signed)
Frederick Kirk is a 83 y.o. male who presents to [facility] today for orthotics consult He has bilateral ankle pain since last Oct that he has been seeing Dr Mattie Marlin.  At today's visit, the left ankle is impacting him more than right.  He would like to resume walking again but his ankle pain is hindering him.  He is wearing a brace to support the arch and has made his own orthotic inserts by stacking a group of them on the lateral aspect of the shoe with a stiff metal bottom support.   His foot is fixed in pronation. These home-built orthotics have helped him the most of any and he is here to discuss if custom orthotics may help him.  He brings with him a pile of orthotics made in the past that have not helped.     Past Medical History:  Diagnosis Date  . History of hiatal hernia   . History of kidney stones   . HOH (hard of hearing)    wears hearing aids B/L   . Hypercholesteremia   . Hypertension   . Insomnia   . Macular degeneration    early B/L  . PVC's (premature ventricular contractions)   . Spinal stenosis of lumbar region with neurogenic claudication   . Wears glasses    Past Surgical History:  Procedure Laterality Date  . BILATERAL CARPAL TUNNEL RELEASE    . HERNIA REPAIR    . laparoscopic knee surgery    . LUMBAR LAMINECTOMY/DECOMPRESSION MICRODISCECTOMY N/A 03/10/2016   Procedure: LAMINECTOMY, DISCECTOMY AND FORAMINOTOMY LUMBAR FOUR LUMBAR FIVE, LUMBAR FIVE SACRAL ONE;  Surgeon: Tressie Stalker, MD;  Location: [facility] OR;  Service: Neurosurgery;  Laterality: N/A;  . TARSAL TUNNEL RELEASE     Social History  Substance Use Topics  . Smoking status: Never Smoker  . Smokeless tobacco: Never Used  . Alcohol use No     ROS:  No new headache, visual changes, nausea, vomiting, diarrhea, constipation, dizziness, abdominal pain, skin rash, fevers, chills, night sweats, weight loss, swollen lymph nodes, body aches, joint swelling,  muscle aches, chest pain, shortness of breath, mood changes, visual or auditory hallucinations.     Medications: Current Outpatient Prescriptions  Medication Sig Dispense Refill  . atorvastatin (LIPITOR) 20 MG tablet Take 1 tablet by mouth every evening.    . gabapentin (NEURONTIN) 300 MG capsule Take 1 capsule by mouth 3 (three) times daily.    . metoprolol succinate (TOPROL-XL) 25 MG 24 hr tablet Take 37.5 mg by mouth daily.    Marland Kitchen terazosin (HYTRIN) 10 MG capsule Take 1 capsule by mouth daily.     No current facility-administered medications for this visit.    Allergies  Allergen Reactions  . No Known Allergies      Exam:  BP (!) 149/58   Pulse (!) 58   Wt 184 lb (83.5 kg)   BMI 25.66 kg/m  General: Well Developed, well nourished, and in no acute distress.  Neuro/Psych: Alert and oriented x3, extra-ocular muscles intact, able to move all 4 extremities, sensation grossly intact. Skin: Warm and dry, no rashes noted.  Respiratory: Not using accessory muscles, speaking in full sentences, trachea midline.  Cardiovascular: Pulses palpable, no extremity edema. Abdomen: Does not appear distended. MSK:  The left foot and ankle show no significant swelling. The foot has a pronation deformity with some ankle subluxation medially. There is bunion and bunionette formation as well. The entire foot and ankle is  not particularly tender but does have limited motion with some crepitations.     No results found for this or any previous visit (from the past 48 hour(s)). No results found.    Assessment and Plan: 83 y.o. male for foot pain. Patient has an interesting deformity and medical history. 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 I think orthotics are probably reasonable to try. Return for orthotics Patient was told about process and time requirement, was interested in having orthotics made while he could be present in the room.      No  orders of the defined types were placed in this encounter.  No orders of the defined types were placed in this encounter.   Discussed warning signs or symptoms. Please see discharge instructions. Patient expresses understanding.

## 2016-06-29 ENCOUNTER — Ambulatory Visit (INDEPENDENT_AMBULATORY_CARE_PROVIDER_SITE_OTHER): Payer: Medicare Other | Admitting: Family Medicine

## 2016-06-29 VITALS — BP 117/73 | HR 71

## 2016-06-29 DIAGNOSIS — M79672 Pain in left foot: Secondary | ICD-10-CM

## 2016-06-29 DIAGNOSIS — M216X2 Other acquired deformities of left foot: Secondary | ICD-10-CM

## 2016-06-29 NOTE — Progress Notes (Signed)
    Orthotics Note:   Patient was fitted for a : standard, cushioned, semi-rigid orthotic. The orthotic was heated and afterward the patient stood on the orthotic blank positioned on the orthotic stand. The patient was positioned in subtalar neutral position and 10 degrees of ankle dorsiflexion in a weight bearing stance. After completion of molding, a stable base was applied to the orthotic blank. The blank was ground to a stable position for weight bearing. Size: 13 Base: [facility] Additional Posting and Padding: Double thick white padding. Neutral The patient ambulated these, and they were very comfortable.  I spent 40 minutes with this patient, greater than 50% was face-to-face time counseling regarding the below diagnosis.

## 2016-06-29 NOTE — Patient Instructions (Signed)
Thank you for coming in today. Take the orthotics for a trial run.  Let me know how you do.  Return as needed.

## 2016-08-04 ENCOUNTER — Emergency Department (INDEPENDENT_AMBULATORY_CARE_PROVIDER_SITE_OTHER): Payer: Medicare Other

## 2016-08-04 ENCOUNTER — Emergency Department
Admission: EM | Admit: 2016-08-04 | Discharge: 2016-08-04 | Disposition: A | Discharge status: Home / Self Care | Payer: Medicare Other | Source: Home / Self Care | Attending: Emergency Medicine | Admitting: Emergency Medicine

## 2016-08-04 DIAGNOSIS — S60221A Contusion of right hand, initial encounter: Secondary | ICD-10-CM | POA: Diagnosis not present

## 2016-08-04 DIAGNOSIS — M25541 Pain in joints of right hand: Secondary | ICD-10-CM

## 2016-08-04 NOTE — ED Triage Notes (Signed)
Pt hit right hand on a metal pipe yesterday.  Had bruising over the last knuckle yesterday.  Today it has been the 3rd and 4th knuckle.  Today also having numbness in the pinky finger.

## 2016-08-04 NOTE — ED Provider Notes (Signed)
[facility]    CSN: [id number] Arrival date & time: 08/04/16  1532     History   Chief Complaint Chief Complaint  Patient presents with  . Finger Injury    Right 4th and 5th    HPI Frederick Kirk is a 83 y.o. male.   HPI       Yesterday, accidentally hit right hand on a metal pipe. Since then, progressive swelling and bruising dorsum right hand, especially fourth and fifth knuckles./MCP areas.  Also noted mild numbness right fifth finger today but no loss of function. He denies any pain at rest, perhaps pain 1 out of 10 that is dull with movement and gripping. Has not tried any particular treatment other than resting at but he is otherwise going about his regular activities. He is here with wife heard Denies any other musculoskeletal complaints. No other neurologic complaints. No headache, chest pain, shortness of red, GI or GU symptoms.Marland Kitchen He mentions that he fractured right fifth finger about 50 years ago and always has mild chronic deformity of the distal joint of the fifth right finger.  Past Medical History:  Diagnosis Date  . History of hiatal hernia   . History of kidney stones   . HOH (hard of hearing)    wears hearing aids B/L   . Hypercholesteremia   . Hypertension   . Insomnia   . Macular degeneration    early B/L  . PVC's (premature ventricular contractions)   . Spinal stenosis of lumbar region with neurogenic claudication   . Wears glasses     Patient Active Problem List   Diagnosis Date Noted  . Acquired pronation deformity of foot, left 06/27/2016  . Spinal stenosis of lumbar region with neurogenic claudication 03/10/2016  . HERPETIC WHITLOW 07/12/2009  . HYPERLIPIDEMIA 07/12/2009    Past Surgical History:  Procedure Laterality Date  . BILATERAL CARPAL TUNNEL RELEASE    . HERNIA REPAIR    . laparoscopic knee surgery    . LUMBAR LAMINECTOMY/DECOMPRESSION MICRODISCECTOMY N/A 03/10/2016   Procedure: LAMINECTOMY, DISCECTOMY AND  FORAMINOTOMY LUMBAR FOUR LUMBAR FIVE, LUMBAR FIVE SACRAL ONE;  Surgeon: Tressie Stalker, MD;  Location: [facility] OR;  Service: Neurosurgery;  Laterality: N/A;  . TARSAL TUNNEL RELEASE         Home Medications    Prior to Admission medications   Medication Sig Start Date End Date Taking? Authorizing Provider  atorvastatin (LIPITOR) 20 MG tablet Take 1 tablet by mouth every evening. 12/14/15   [provider]  gabapentin (NEURONTIN) 300 MG capsule Take 1 capsule by mouth 3 (three) times daily. 02/18/16   [provider]  metoprolol succinate (TOPROL-XL) 25 MG 24 hr tablet Take 37.5 mg by mouth daily. 01/29/16   [provider]  terazosin (HYTRIN) 10 MG capsule Take 1 capsule by mouth daily. 02/26/16   [provider]    Family History Family History  Problem Relation Age of Onset  . ALS Mother     Social History Social History  Substance Use Topics  . Smoking status: Never Smoker  . Smokeless tobacco: Never Used  . Alcohol use No     Allergies   No known allergies   Review of Systems Review of Systems  All other systems reviewed and are negative.    Physical Exam Triage Vital Signs ED Triage Vitals  Enc Vitals Group     BP 08/04/16 1549 (!) 149/73     Pulse Rate 08/04/16 1549 68  Resp --      Temp 08/04/16 1549 97.6 F (36.4 C)     Temp Source 08/04/16 1549 Oral     SpO2 08/04/16 1549 97 %     Weight 08/04/16 1550 179 lb (81.2 kg)     Height 08/04/16 1550 5\' 11"  (1.803 m)     Head Circumference --      Peak Flow --      Pain Score 08/04/16 1550 0     Pain Loc --      Pain Edu? --      Excl. in GC? --    No data found.   Updated Vital Signs BP (!) 149/73 (BP Location: Left Arm)   Pulse 68   Temp 97.6 F (36.4 C) (Oral)   Ht 5\' 11"  (1.803 m)   Wt 179 lb (81.2 kg)   SpO2 97%   BMI 24.97 kg/m   Visual Acuity Right Eye Distance:   Left Eye Distance:   Bilateral Distance:    Right Eye Near:   Left Eye Near:      Bilateral Near:     Physical Exam  Constitutional: He is oriented to person, place, and time. He appears well-developed and well-nourished. No distress.  Pleasant male, cooperative, no distress. Alert and oriented 3. He is ambulatory with normal gait.  HENT:  Head: Normocephalic and atraumatic.  Eyes: Pupils are equal, round, and reactive to light. No scleral icterus.  Neck: Normal range of motion. Neck supple.  Cardiovascular: Normal rate and regular rhythm.   Pulmonary/Chest: Effort normal.  Abdominal: He exhibits no distension.  Musculoskeletal:       Right hand: He exhibits tenderness.       Hands: Neurological: He is alert and oriented to person, place, and time. No cranial nerve deficit.  Skin: Skin is warm and dry.  Psychiatric: He has a normal mood and affect. His behavior is normal.  Vitals reviewed.    UC Treatments / Results  Labs (all labs ordered are listed, but only abnormal results are displayed) Labs Reviewed - No data to display  EKG  EKG Interpretation None       Radiology Dg Hand Complete Right  Result Date: 08/04/2016 CLINICAL DATA:  Direct trauma yesterday to the fourth and fifth MCP joint regions. EXAM: RIGHT HAND - COMPLETE 3+ VIEW COMPARISON:  None in PACs FINDINGS: The bones are subjectively adequately mineralized. There is no acute fracture or dislocation. There is chronic deformity of the fifth finger centered on the PIP joint consistent with the previous history of trauma. There is mild degenerative narrowing of the IP joint spaces elsewhere. The MCP joint spaces are well maintained. There is mild degenerative change of the first [facility] joint. IMPRESSION: There is no acute fracture or dislocation of the bones of the right hand. There are mild degenerative changes as described as well as post traumatic chains centered over the PIP joint of the fifth finger. Electronically Signed   By: David  [place] M.D.   On: 08/04/2016 16:26     Procedures Procedures (including critical care time)  Medications Ordered in UC Medications - No data to display   Initial Impression / Assessment and Plan / UC Course  I have reviewed the triage vital signs and the nursing notes.  Pertinent labs & imaging results that were available during my care of the patient were reviewed by me and considered in my medical decision making (see chart for details).      Final  Clinical Impressions(s) / UC Diagnoses   Final diagnoses:  Contusion of right hand, initial encounter  Reviewed with patient and wife right hand x-ray showing no acute fracture or dislocation. He has old degenerative changes that are unrelated to this acute contusion. I explained that bruising should resolve within a couple weeks, but we discussed red flags to watch out for. Follow-up here or with PCP or emergency room if any red flags. Gave him print out of x-ray report and print out of AP view x-ray at his request. He declined Ace bandage. I explained that I would expect the numbness in right fifth finger to resolve within the next week or 2, if not, recheck here or with PCP. Over 25 minutes spent, greater than 50% of the time spent for counseling and coordination of care.   New Prescriptions Discharge Medication List as of 08/04/2016  4:46 PM    OTC ibuprofen if needed   Lajean ManesMassey, David, MD 08/04/16 1650

## 2016-08-31 ENCOUNTER — Emergency Department
Admission: EM | Admit: 2016-08-31 | Discharge: 2016-08-31 | Disposition: A | Discharge status: Home / Self Care | Payer: Medicare Other | Source: Home / Self Care | Attending: Family Medicine | Admitting: Family Medicine

## 2016-08-31 ENCOUNTER — Encounter: Payer: Self-pay | Admitting: Emergency Medicine

## 2016-08-31 DIAGNOSIS — R233 Spontaneous ecchymoses: Secondary | ICD-10-CM

## 2016-08-31 NOTE — ED Provider Notes (Signed)
[facility]    CSN: [id number] Arrival date & time: 08/31/16  1433     History   Chief Complaint Chief Complaint  Patient presents with  . Hair/Scalp Problem    HPI Frederick Kirk is a 83 y.o. male.   Patient noticed a small rash on his scalp today.  No pain, swelling, or itching.  He recalls no injury or insect bite.  He feels well otherwise.   The history is provided by the patient.  Rash  Location: scalp. Quality: dryness, painful and redness   Quality: not blistering, not bruising, not burning, not draining, not itchy, not peeling, not scaling, not swelling and not weeping   Pain details:    Severity:  Mild   Onset quality:  Sudden   Duration:  6 hours   Timing:  Constant   Progression:  Unchanged Severity:  Mild Onset quality:  Sudden Duration:  6 hours Timing:  Constant Progression:  Unchanged Chronicity:  New Context: not animal contact, not chemical exposure, not food, not insect bite/sting, not medications, not new detergent/soap and not plant contact   Relieved by:  None tried Worsened by:  Nothing Ineffective treatments:  None tried Associated symptoms: no abdominal pain, no diarrhea, no fatigue, no fever, no headaches, no induration, no joint pain, no myalgias, no nausea, no shortness of breath, no sore throat, no throat swelling, no tongue swelling, no URI, not vomiting and not wheezing     Past Medical History:  Diagnosis Date  . History of hiatal hernia   . History of kidney stones   . HOH (hard of hearing)    wears hearing aids B/L   . Hypercholesteremia   . Hypertension   . Insomnia   . Macular degeneration    early B/L  . PVC's (premature ventricular contractions)   . Spinal stenosis of lumbar region with neurogenic claudication   . Wears glasses     Patient Active Problem List   Diagnosis Date Noted  . Acquired pronation deformity of foot, left 06/27/2016  . Spinal stenosis of lumbar region with neurogenic claudication  03/10/2016  . HERPETIC WHITLOW 07/12/2009  . HYPERLIPIDEMIA 07/12/2009    Past Surgical History:  Procedure Laterality Date  . BILATERAL CARPAL TUNNEL RELEASE    . HERNIA REPAIR    . laparoscopic knee surgery    . LUMBAR LAMINECTOMY/DECOMPRESSION MICRODISCECTOMY N/A 03/10/2016   Procedure: LAMINECTOMY, DISCECTOMY AND FORAMINOTOMY LUMBAR FOUR LUMBAR FIVE, LUMBAR FIVE SACRAL ONE;  Surgeon: Tressie StalkerJeffrey Jenkins, MD;  [facility];  Service: Neurosurgery;  Laterality: N/A;  . TARSAL TUNNEL RELEASE         Home Medications    Prior to Admission medications   Medication Sig Start Date End Date Taking? Authorizing Provider  atorvastatin (LIPITOR) 20 MG tablet Take 1 tablet by mouth every evening. 12/14/15   [provider]  gabapentin (NEURONTIN) 300 MG capsule Take 1 capsule by mouth 3 (three) times daily. 02/18/16   [provider]  metoprolol succinate (TOPROL-XL) 25 MG 24 hr tablet Take 37.5 mg by mouth daily. 01/29/16   [provider]    Family History Family History  Problem Relation Age of Onset  . ALS Mother     Social History Social History  Substance Use Topics  . Smoking status: Never Smoker  . Smokeless tobacco: Never Used  . Alcohol use No     Allergies   No known allergies   Review of Systems Review of Systems  Constitutional: Negative  for fatigue and fever.  HENT: Negative for sore throat.   Respiratory: Negative for shortness of breath and wheezing.   Gastrointestinal: Negative for abdominal pain, diarrhea, nausea and vomiting.  Musculoskeletal: Negative for arthralgias and myalgias.  Skin: Positive for rash.  Neurological: Negative for headaches.  All other systems reviewed and are negative.    Physical Exam Triage Vital Signs ED Triage Vitals  Enc Vitals Group     BP 08/31/16 1502 126/65     Pulse Rate 08/31/16 1502 64     Resp --      Temp 08/31/16 1502 97.6 F (36.4 C)     Temp Source 08/31/16 1502 Oral     SpO2  08/31/16 1502 97 %     Weight 08/31/16 1510 178 lb (80.7 kg)     Height 08/31/16 1510 5\' 11"  (1.803 m)     Head Circumference --      Peak Flow --      Pain Score 08/31/16 1510 0     Pain Loc --      Pain Edu? --      Excl. in GC? --    No data found.   Updated Vital Signs BP 126/65 (BP Location: Left Arm)   Pulse 64   Temp 97.6 F (36.4 C) (Oral)   Ht 5\' 11"  (1.803 m)   Wt 178 lb (80.7 kg)   SpO2 97%   BMI 24.83 kg/m   Visual Acuity Right Eye Distance:   Left Eye Distance:   Bilateral Distance:    Right Eye Near:   Left Eye Near:    Bilateral Near:     Physical Exam  Constitutional: He is oriented to person, place, and time. He appears well-developed and well-nourished.  HENT:  Head: Atraumatic.    Right Ear: External ear normal.  Left Ear: External ear normal.  Nose: Nose normal.  Mouth/Throat: Oropharynx is clear and moist.  At vertex of scalp is a small, non-blanching petechial eruption about 2cm by 2cm.  Eyes: Pupils are equal, round, and reactive to light. Conjunctivae are normal.  Neck: Neck supple.  Cardiovascular: Normal rate.   Pulmonary/Chest: Effort normal.  Musculoskeletal: He exhibits no edema.  Lymphadenopathy:    He has no cervical adenopathy.  Neurological: He is alert and oriented to person, place, and time.  Skin: Skin is warm and dry.  Nursing note and vitals reviewed.     UC Treatments / Results  Labs (all labs ordered are listed, but only abnormal results are displayed) Labs Reviewed - CBC:  WBC 5.6; LY 33.1; MO 6.1; GR 60.8; Hgb 13.8; Platelets 127   EKG  EKG Interpretation None       Radiology No results found.  Procedures Procedures (including critical care time)  Medications Ordered in UC Medications - No data to display   Initial Impression / Assessment and Plan / UC Course  I have reviewed the triage vital signs and the nursing notes.  Pertinent labs & imaging results that were available during my care of  the patient were reviewed by me and considered in my medical decision making (see chart for details).    Unknown etiology.  Small petechial eruption noted only on scalp; probably traumatic. Patient has history of thrombocytopenia.  Platelet count unchanged today. Reassurance.  Observation only for now.  Followup with PCP if develops similar lesions elsewhere.    Final Clinical Impressions(s) / UC Diagnoses   Final diagnoses:  Petechial rash    New  Prescriptions New Prescriptions   No medications on file     Lattie Haw, MD 08/31/16 [phone]

## 2016-08-31 NOTE — ED Triage Notes (Signed)
Reddish contusion on scalp, denies injury, no pain.

## 2016-09-01 ENCOUNTER — Telehealth (INDEPENDENT_AMBULATORY_CARE_PROVIDER_SITE_OTHER): Payer: Medicare Other

## 2016-09-01 DIAGNOSIS — R233 Spontaneous ecchymoses: Secondary | ICD-10-CM | POA: Diagnosis not present

## 2016-09-01 LAB — POCT CBC W AUTO DIFF (K'VILLE URGENT CARE)

## 2016-09-01 NOTE — Telephone Encounter (Signed)
Encounter opened for CBC order 

## 2017-11-16 ENCOUNTER — Encounter: Payer: Self-pay | Admitting: Family Medicine

## 2017-11-16 ENCOUNTER — Ambulatory Visit (INDEPENDENT_AMBULATORY_CARE_PROVIDER_SITE_OTHER): Payer: Medicare Other | Admitting: Family Medicine

## 2017-11-16 VITALS — BP 153/71 | HR 67 | Wt 183.0 lb

## 2017-11-16 DIAGNOSIS — M25472 Effusion, left ankle: Secondary | ICD-10-CM | POA: Diagnosis not present

## 2017-11-16 NOTE — Patient Instructions (Signed)
Thank you for coming in today. Arch Straps Arch Support Bandages (#P60)  I think the ridge is an old scar.   Return sooner if needed.   Body helix ankle sleeve may be helpful as well.

## 2017-11-16 NOTE — Progress Notes (Signed)
Frederick Kirk is a 84 y.o. male who presents to [facility] today for ridge of skin at the left medial ankle.  Frederick Kirk purchased some new shoes to replace his old shoes recently.  He transferred his orthotics to the new shoes.  He notes that since he has been wearing the new shoes he is developed a ridge of skin on his medial ankle.  He notes this is not painful at all and he feels pretty well otherwise.  He is worried that this may represent a blood clot or some other dangerous issue.  He has not had much treatment yet.  No fevers or chills nausea vomiting or diarrhea.  In retrospect with his wife's help he does recall a history of left tarsal tunnel surgery in the 90s.    ROS:  As above  Exam:  BP (!) 153/71   Pulse 67   Wt 183 lb (83 kg)   BMI 25.52 kg/m  General: Well Developed, well nourished, and in no acute distress.  Neuro/Psych: Alert and oriented x3, extra-ocular muscles intact, able to move all 4 extremities, sensation grossly intact. Skin: Warm and dry, no rashes noted.  Respiratory: Not using accessory muscles, speaking in full sentences, trachea midline.  Cardiovascular: Pulses palpable, no extremity edema. Abdomen: Does not appear distended. MSK:  Left ankle largely normal-appearing with well mature scar at the medial ankle overlying the tarsal tunnel.  This is the ridge of skin patient describes.  Nontender.  Ankle motion is normal.    Lab and Radiology Results Limited musculoskeletal ultrasound of the left medial ankle reveals an intact appearing posterior tibialis tendon.  Normal-appearing neurovascular structures.  Normal-appearing bony structures.    Assessment and Plan: 84 y.o. male with  Left medial ankle ridge of skin is likely a mature scar.  I believe it is a little bit more noticeable now due to change in foot alignment with new shoes.  We discussed options including compressive ankle sleeve socks and  modification of shoe.  Plan for watchful waiting and recheck as needed.  I spent 15 minutes with this patient, greater than 50% was face-to-face time counseling regarding ddx and plan.   Historical information moved to improve visibility of documentation.  Past Medical History:  Diagnosis Date  . History of hiatal hernia   . History of kidney stones   . HOH (hard of hearing)    wears hearing aids B/L   . Hypercholesteremia   . Hypertension   . Insomnia   . Macular degeneration    early B/L  . PVC's (premature ventricular contractions)   . Spinal stenosis of lumbar region with neurogenic claudication   . Wears glasses    Past Surgical History:  Procedure Laterality Date  . BILATERAL CARPAL TUNNEL RELEASE    . HERNIA REPAIR    . laparoscopic knee surgery    . LUMBAR LAMINECTOMY/DECOMPRESSION MICRODISCECTOMY N/A 03/10/2016   Procedure: LAMINECTOMY, DISCECTOMY AND FORAMINOTOMY LUMBAR FOUR LUMBAR FIVE, LUMBAR FIVE SACRAL ONE;  Surgeon: Tressie Stalker, MD;  Location: [facility] OR;  Service: Neurosurgery;  Laterality: N/A;  . TARSAL TUNNEL RELEASE     Social History   Tobacco Use  . Smoking status: Never Smoker  . Smokeless tobacco: Never Used  Substance Use Topics  . Alcohol use: No   family history includes ALS in his mother.  Medications: Current Outpatient Medications  Medication Sig Dispense Refill  . atorvastatin (LIPITOR) 20 MG tablet Take 1 tablet by mouth every  evening.    . gabapentin (NEURONTIN) 300 MG capsule Take 1 capsule by mouth 3 (three) times daily.    . metoprolol succinate (TOPROL-XL) 25 MG 24 hr tablet Take 37.5 mg by mouth daily.     No current facility-administered medications for this visit.    Allergies  Allergen Reactions  . No Known Allergies       Discussed warning signs or symptoms. Please see discharge instructions. Patient expresses understanding.

## 2022-08-09 DIAGNOSIS — B351 Tinea unguium: Secondary | ICD-10-CM | POA: Diagnosis not present

## 2022-08-09 DIAGNOSIS — L603 Nail dystrophy: Secondary | ICD-10-CM | POA: Diagnosis not present

## 2022-08-09 DIAGNOSIS — L600 Ingrowing nail: Secondary | ICD-10-CM | POA: Diagnosis not present

## 2022-08-15 DIAGNOSIS — H353122 Nonexudative age-related macular degeneration, left eye, intermediate dry stage: Secondary | ICD-10-CM | POA: Diagnosis not present

## 2022-08-15 DIAGNOSIS — H40013 Open angle with borderline findings, low risk, bilateral: Secondary | ICD-10-CM | POA: Diagnosis not present

## 2022-08-15 DIAGNOSIS — H353211 Exudative age-related macular degeneration, right eye, with active choroidal neovascularization: Secondary | ICD-10-CM | POA: Diagnosis not present

## 2022-08-15 DIAGNOSIS — Z961 Presence of intraocular lens: Secondary | ICD-10-CM | POA: Diagnosis not present

## 2022-08-15 DIAGNOSIS — H35373 Puckering of macula, bilateral: Secondary | ICD-10-CM | POA: Diagnosis not present

## 2022-08-17 DIAGNOSIS — D485 Neoplasm of uncertain behavior of skin: Secondary | ICD-10-CM | POA: Diagnosis not present

## 2022-08-17 DIAGNOSIS — L821 Other seborrheic keratosis: Secondary | ICD-10-CM | POA: Diagnosis not present

## 2022-08-17 DIAGNOSIS — L814 Other melanin hyperpigmentation: Secondary | ICD-10-CM | POA: Diagnosis not present

## 2022-08-19 DIAGNOSIS — Z9889 Other specified postprocedural states: Secondary | ICD-10-CM | POA: Diagnosis not present

## 2022-08-19 DIAGNOSIS — S81801A Unspecified open wound, right lower leg, initial encounter: Secondary | ICD-10-CM | POA: Diagnosis not present

## 2022-08-22 DIAGNOSIS — D0471 Carcinoma in situ of skin of right lower limb, including hip: Secondary | ICD-10-CM | POA: Diagnosis not present

## 2022-09-06 DIAGNOSIS — L570 Actinic keratosis: Secondary | ICD-10-CM | POA: Diagnosis not present

## 2022-09-06 DIAGNOSIS — L821 Other seborrheic keratosis: Secondary | ICD-10-CM | POA: Diagnosis not present

## 2022-09-12 DIAGNOSIS — H35373 Puckering of macula, bilateral: Secondary | ICD-10-CM | POA: Diagnosis not present

## 2022-09-12 DIAGNOSIS — H353122 Nonexudative age-related macular degeneration, left eye, intermediate dry stage: Secondary | ICD-10-CM | POA: Diagnosis not present

## 2022-09-12 DIAGNOSIS — Z961 Presence of intraocular lens: Secondary | ICD-10-CM | POA: Diagnosis not present

## 2022-09-12 DIAGNOSIS — H35341 Macular cyst, hole, or pseudohole, right eye: Secondary | ICD-10-CM | POA: Diagnosis not present

## 2022-09-12 DIAGNOSIS — H353211 Exudative age-related macular degeneration, right eye, with active choroidal neovascularization: Secondary | ICD-10-CM | POA: Diagnosis not present

## 2022-09-29 DIAGNOSIS — I10 Essential (primary) hypertension: Secondary | ICD-10-CM | POA: Diagnosis not present

## 2022-09-29 DIAGNOSIS — G4700 Insomnia, unspecified: Secondary | ICD-10-CM | POA: Diagnosis not present

## 2022-09-29 DIAGNOSIS — S81801A Unspecified open wound, right lower leg, initial encounter: Secondary | ICD-10-CM | POA: Diagnosis not present

## 2022-10-18 DIAGNOSIS — L603 Nail dystrophy: Secondary | ICD-10-CM | POA: Diagnosis not present

## 2022-10-18 DIAGNOSIS — B351 Tinea unguium: Secondary | ICD-10-CM | POA: Diagnosis not present

## 2022-10-18 DIAGNOSIS — L600 Ingrowing nail: Secondary | ICD-10-CM | POA: Diagnosis not present

## 2022-10-19 DIAGNOSIS — N3281 Overactive bladder: Secondary | ICD-10-CM | POA: Diagnosis not present

## 2022-10-26 DIAGNOSIS — G4700 Insomnia, unspecified: Secondary | ICD-10-CM | POA: Diagnosis not present

## 2022-10-26 DIAGNOSIS — Z8739 Personal history of other diseases of the musculoskeletal system and connective tissue: Secondary | ICD-10-CM | POA: Diagnosis not present

## 2022-10-26 DIAGNOSIS — R682 Dry mouth, unspecified: Secondary | ICD-10-CM | POA: Diagnosis not present

## 2022-10-26 DIAGNOSIS — N3281 Overactive bladder: Secondary | ICD-10-CM | POA: Diagnosis not present

## 2022-11-08 DIAGNOSIS — L570 Actinic keratosis: Secondary | ICD-10-CM | POA: Diagnosis not present

## 2022-11-08 DIAGNOSIS — D239 Other benign neoplasm of skin, unspecified: Secondary | ICD-10-CM | POA: Diagnosis not present

## 2022-11-08 DIAGNOSIS — L2089 Other atopic dermatitis: Secondary | ICD-10-CM | POA: Diagnosis not present

## 2022-11-09 DIAGNOSIS — Z961 Presence of intraocular lens: Secondary | ICD-10-CM | POA: Diagnosis not present

## 2022-11-09 DIAGNOSIS — H43813 Vitreous degeneration, bilateral: Secondary | ICD-10-CM | POA: Diagnosis not present

## 2022-11-09 DIAGNOSIS — H35373 Puckering of macula, bilateral: Secondary | ICD-10-CM | POA: Diagnosis not present

## 2022-11-09 DIAGNOSIS — H353221 Exudative age-related macular degeneration, left eye, with active choroidal neovascularization: Secondary | ICD-10-CM | POA: Diagnosis not present

## 2022-11-09 DIAGNOSIS — H35341 Macular cyst, hole, or pseudohole, right eye: Secondary | ICD-10-CM | POA: Diagnosis not present

## 2022-11-09 DIAGNOSIS — H353112 Nonexudative age-related macular degeneration, right eye, intermediate dry stage: Secondary | ICD-10-CM | POA: Diagnosis not present

## 2022-12-07 DIAGNOSIS — E782 Mixed hyperlipidemia: Secondary | ICD-10-CM | POA: Diagnosis not present

## 2022-12-07 DIAGNOSIS — I10 Essential (primary) hypertension: Secondary | ICD-10-CM | POA: Diagnosis not present

## 2022-12-07 DIAGNOSIS — I447 Left bundle-branch block, unspecified: Secondary | ICD-10-CM | POA: Diagnosis not present

## 2022-12-07 DIAGNOSIS — I493 Ventricular premature depolarization: Secondary | ICD-10-CM | POA: Diagnosis not present

## 2022-12-07 DIAGNOSIS — I491 Atrial premature depolarization: Secondary | ICD-10-CM | POA: Diagnosis not present

## 2022-12-08 DIAGNOSIS — I499 Cardiac arrhythmia, unspecified: Secondary | ICD-10-CM | POA: Diagnosis not present

## 2022-12-08 DIAGNOSIS — I447 Left bundle-branch block, unspecified: Secondary | ICD-10-CM | POA: Diagnosis not present

## 2022-12-08 DIAGNOSIS — I491 Atrial premature depolarization: Secondary | ICD-10-CM | POA: Diagnosis not present

## 2022-12-08 DIAGNOSIS — R270 Ataxia, unspecified: Secondary | ICD-10-CM | POA: Diagnosis not present

## 2022-12-08 DIAGNOSIS — I440 Atrioventricular block, first degree: Secondary | ICD-10-CM | POA: Diagnosis not present

## 2022-12-20 DIAGNOSIS — B351 Tinea unguium: Secondary | ICD-10-CM | POA: Diagnosis not present

## 2022-12-20 DIAGNOSIS — L600 Ingrowing nail: Secondary | ICD-10-CM | POA: Diagnosis not present

## 2022-12-20 DIAGNOSIS — L603 Nail dystrophy: Secondary | ICD-10-CM | POA: Diagnosis not present

## 2022-12-21 DIAGNOSIS — I10 Essential (primary) hypertension: Secondary | ICD-10-CM | POA: Diagnosis not present

## 2022-12-21 DIAGNOSIS — L2089 Other atopic dermatitis: Secondary | ICD-10-CM | POA: Diagnosis not present

## 2022-12-21 DIAGNOSIS — E785 Hyperlipidemia, unspecified: Secondary | ICD-10-CM | POA: Diagnosis not present

## 2022-12-28 DIAGNOSIS — E559 Vitamin D deficiency, unspecified: Secondary | ICD-10-CM | POA: Diagnosis not present

## 2022-12-28 DIAGNOSIS — I10 Essential (primary) hypertension: Secondary | ICD-10-CM | POA: Diagnosis not present

## 2022-12-28 DIAGNOSIS — H6121 Impacted cerumen, right ear: Secondary | ICD-10-CM | POA: Diagnosis not present

## 2022-12-28 DIAGNOSIS — G4700 Insomnia, unspecified: Secondary | ICD-10-CM | POA: Diagnosis not present

## 2022-12-28 DIAGNOSIS — E785 Hyperlipidemia, unspecified: Secondary | ICD-10-CM | POA: Diagnosis not present

## 2022-12-28 DIAGNOSIS — D649 Anemia, unspecified: Secondary | ICD-10-CM | POA: Diagnosis not present

## 2022-12-28 DIAGNOSIS — Z0000 Encounter for general adult medical examination without abnormal findings: Secondary | ICD-10-CM | POA: Diagnosis not present

## 2023-01-10 DIAGNOSIS — N3281 Overactive bladder: Secondary | ICD-10-CM | POA: Diagnosis not present

## 2023-01-11 DIAGNOSIS — D539 Nutritional anemia, unspecified: Secondary | ICD-10-CM | POA: Diagnosis not present

## 2023-01-11 DIAGNOSIS — G4700 Insomnia, unspecified: Secondary | ICD-10-CM | POA: Diagnosis not present

## 2023-01-11 DIAGNOSIS — D649 Anemia, unspecified: Secondary | ICD-10-CM | POA: Diagnosis not present

## 2023-01-12 DIAGNOSIS — H353222 Exudative age-related macular degeneration, left eye, with inactive choroidal neovascularization: Secondary | ICD-10-CM | POA: Diagnosis not present

## 2023-01-12 DIAGNOSIS — H35373 Puckering of macula, bilateral: Secondary | ICD-10-CM | POA: Diagnosis not present

## 2023-01-12 DIAGNOSIS — H353211 Exudative age-related macular degeneration, right eye, with active choroidal neovascularization: Secondary | ICD-10-CM | POA: Diagnosis not present

## 2023-01-12 DIAGNOSIS — H40013 Open angle with borderline findings, low risk, bilateral: Secondary | ICD-10-CM | POA: Diagnosis not present

## 2023-01-12 DIAGNOSIS — H33321 Round hole, right eye: Secondary | ICD-10-CM | POA: Diagnosis not present

## 2023-01-12 DIAGNOSIS — Z961 Presence of intraocular lens: Secondary | ICD-10-CM | POA: Diagnosis not present

## 2023-01-31 DIAGNOSIS — M67442 Ganglion, left hand: Secondary | ICD-10-CM | POA: Diagnosis not present

## 2023-02-21 DIAGNOSIS — B351 Tinea unguium: Secondary | ICD-10-CM | POA: Diagnosis not present

## 2023-02-21 DIAGNOSIS — L600 Ingrowing nail: Secondary | ICD-10-CM | POA: Diagnosis not present

## 2023-02-21 DIAGNOSIS — L603 Nail dystrophy: Secondary | ICD-10-CM | POA: Diagnosis not present

## 2023-02-22 DIAGNOSIS — M71342 Other bursal cyst, left hand: Secondary | ICD-10-CM | POA: Diagnosis not present

## 2023-02-24 DIAGNOSIS — M67442 Ganglion, left hand: Secondary | ICD-10-CM | POA: Diagnosis not present

## 2023-04-05 DIAGNOSIS — H532 Diplopia: Secondary | ICD-10-CM | POA: Diagnosis not present

## 2023-04-05 DIAGNOSIS — G43B0 Ophthalmoplegic migraine, not intractable: Secondary | ICD-10-CM | POA: Diagnosis not present

## 2023-04-05 DIAGNOSIS — H353211 Exudative age-related macular degeneration, right eye, with active choroidal neovascularization: Secondary | ICD-10-CM | POA: Diagnosis not present

## 2023-04-05 DIAGNOSIS — H353122 Nonexudative age-related macular degeneration, left eye, intermediate dry stage: Secondary | ICD-10-CM | POA: Diagnosis not present

## 2023-04-05 DIAGNOSIS — H35373 Puckering of macula, bilateral: Secondary | ICD-10-CM | POA: Diagnosis not present

## 2023-04-05 DIAGNOSIS — Z961 Presence of intraocular lens: Secondary | ICD-10-CM | POA: Diagnosis not present

## 2023-04-12 DIAGNOSIS — M71342 Other bursal cyst, left hand: Secondary | ICD-10-CM | POA: Diagnosis not present

## 2023-04-12 DIAGNOSIS — L905 Scar conditions and fibrosis of skin: Secondary | ICD-10-CM | POA: Diagnosis not present

## 2023-04-25 DIAGNOSIS — L600 Ingrowing nail: Secondary | ICD-10-CM | POA: Diagnosis not present

## 2023-04-25 DIAGNOSIS — L603 Nail dystrophy: Secondary | ICD-10-CM | POA: Diagnosis not present

## 2023-04-25 DIAGNOSIS — B351 Tinea unguium: Secondary | ICD-10-CM | POA: Diagnosis not present

## 2023-05-22 DIAGNOSIS — K5900 Constipation, unspecified: Secondary | ICD-10-CM | POA: Diagnosis not present

## 2023-06-07 DIAGNOSIS — H353122 Nonexudative age-related macular degeneration, left eye, intermediate dry stage: Secondary | ICD-10-CM | POA: Diagnosis not present

## 2023-06-07 DIAGNOSIS — H35341 Macular cyst, hole, or pseudohole, right eye: Secondary | ICD-10-CM | POA: Diagnosis not present

## 2023-06-07 DIAGNOSIS — Z961 Presence of intraocular lens: Secondary | ICD-10-CM | POA: Diagnosis not present

## 2023-06-07 DIAGNOSIS — H35373 Puckering of macula, bilateral: Secondary | ICD-10-CM | POA: Diagnosis not present

## 2023-06-07 DIAGNOSIS — H353211 Exudative age-related macular degeneration, right eye, with active choroidal neovascularization: Secondary | ICD-10-CM | POA: Diagnosis not present

## 2023-07-05 DIAGNOSIS — M722 Plantar fascial fibromatosis: Secondary | ICD-10-CM | POA: Diagnosis not present

## 2023-07-10 DIAGNOSIS — H35373 Puckering of macula, bilateral: Secondary | ICD-10-CM | POA: Diagnosis not present

## 2023-07-10 DIAGNOSIS — H353211 Exudative age-related macular degeneration, right eye, with active choroidal neovascularization: Secondary | ICD-10-CM | POA: Diagnosis not present

## 2023-07-10 DIAGNOSIS — H353122 Nonexudative age-related macular degeneration, left eye, intermediate dry stage: Secondary | ICD-10-CM | POA: Diagnosis not present

## 2023-07-10 DIAGNOSIS — H35341 Macular cyst, hole, or pseudohole, right eye: Secondary | ICD-10-CM | POA: Diagnosis not present

## 2023-07-10 DIAGNOSIS — Z961 Presence of intraocular lens: Secondary | ICD-10-CM | POA: Diagnosis not present

## 2023-07-11 DIAGNOSIS — L603 Nail dystrophy: Secondary | ICD-10-CM | POA: Diagnosis not present

## 2023-07-11 DIAGNOSIS — L600 Ingrowing nail: Secondary | ICD-10-CM | POA: Diagnosis not present

## 2023-07-11 DIAGNOSIS — B351 Tinea unguium: Secondary | ICD-10-CM | POA: Diagnosis not present

## 2023-08-04 DIAGNOSIS — M79672 Pain in left foot: Secondary | ICD-10-CM | POA: Diagnosis not present

## 2023-08-04 DIAGNOSIS — M722 Plantar fascial fibromatosis: Secondary | ICD-10-CM | POA: Diagnosis not present

## 2023-08-07 DIAGNOSIS — H35373 Puckering of macula, bilateral: Secondary | ICD-10-CM | POA: Diagnosis not present

## 2023-08-07 DIAGNOSIS — Z961 Presence of intraocular lens: Secondary | ICD-10-CM | POA: Diagnosis not present

## 2023-08-07 DIAGNOSIS — H353211 Exudative age-related macular degeneration, right eye, with active choroidal neovascularization: Secondary | ICD-10-CM | POA: Diagnosis not present

## 2023-08-07 DIAGNOSIS — H35033 Hypertensive retinopathy, bilateral: Secondary | ICD-10-CM | POA: Diagnosis not present

## 2023-08-07 DIAGNOSIS — H40013 Open angle with borderline findings, low risk, bilateral: Secondary | ICD-10-CM | POA: Diagnosis not present

## 2023-08-07 DIAGNOSIS — H353122 Nonexudative age-related macular degeneration, left eye, intermediate dry stage: Secondary | ICD-10-CM | POA: Diagnosis not present

## 2023-09-01 DIAGNOSIS — M79672 Pain in left foot: Secondary | ICD-10-CM | POA: Diagnosis not present

## 2023-09-01 DIAGNOSIS — M722 Plantar fascial fibromatosis: Secondary | ICD-10-CM | POA: Diagnosis not present

## 2023-09-12 DIAGNOSIS — L600 Ingrowing nail: Secondary | ICD-10-CM | POA: Diagnosis not present

## 2023-09-12 DIAGNOSIS — B351 Tinea unguium: Secondary | ICD-10-CM | POA: Diagnosis not present

## 2023-09-12 DIAGNOSIS — L603 Nail dystrophy: Secondary | ICD-10-CM | POA: Diagnosis not present

## 2023-09-18 DIAGNOSIS — H35373 Puckering of macula, bilateral: Secondary | ICD-10-CM | POA: Diagnosis not present

## 2023-09-18 DIAGNOSIS — H353122 Nonexudative age-related macular degeneration, left eye, intermediate dry stage: Secondary | ICD-10-CM | POA: Diagnosis not present

## 2023-09-18 DIAGNOSIS — H35341 Macular cyst, hole, or pseudohole, right eye: Secondary | ICD-10-CM | POA: Diagnosis not present

## 2023-09-18 DIAGNOSIS — H353211 Exudative age-related macular degeneration, right eye, with active choroidal neovascularization: Secondary | ICD-10-CM | POA: Diagnosis not present

## 2023-09-18 DIAGNOSIS — Z961 Presence of intraocular lens: Secondary | ICD-10-CM | POA: Diagnosis not present

## 2023-09-20 DIAGNOSIS — M79672 Pain in left foot: Secondary | ICD-10-CM | POA: Diagnosis not present

## 2023-09-20 DIAGNOSIS — R6889 Other general symptoms and signs: Secondary | ICD-10-CM | POA: Diagnosis not present

## 2023-09-20 DIAGNOSIS — M722 Plantar fascial fibromatosis: Secondary | ICD-10-CM | POA: Diagnosis not present

## 2023-10-16 DIAGNOSIS — Z789 Other specified health status: Secondary | ICD-10-CM | POA: Diagnosis not present

## 2023-10-16 DIAGNOSIS — R682 Dry mouth, unspecified: Secondary | ICD-10-CM | POA: Diagnosis not present

## 2023-10-16 DIAGNOSIS — G4700 Insomnia, unspecified: Secondary | ICD-10-CM | POA: Diagnosis not present

## 2023-10-16 DIAGNOSIS — M722 Plantar fascial fibromatosis: Secondary | ICD-10-CM | POA: Diagnosis not present

## 2023-12-08 ENCOUNTER — Telehealth: Payer: Self-pay

## 2023-12-08 NOTE — Telephone Encounter (Signed)
 [facility]gienist as PCP. Called the patient and he states that he has PCP elsewhere.
